# Patient Record
Sex: Female | Born: 2000 | Race: White | Hispanic: No | Marital: Single | Smoking: Current every day smoker
Health system: Southern US, Community
[De-identification: ages and names within clinical notes are randomized; demographics above are authoritative.]

## PROBLEM LIST (undated history)

## (undated) ENCOUNTER — Ambulatory Visit: Admission: EM | Payer: No Typology Code available for payment source

## (undated) ENCOUNTER — Inpatient Hospital Stay: Payer: Self-pay

## (undated) ENCOUNTER — Ambulatory Visit: Admission: EM | Payer: MEDICAID | Source: Home / Self Care

## (undated) DIAGNOSIS — E039 Hypothyroidism, unspecified: Secondary | ICD-10-CM

## (undated) DIAGNOSIS — O99280 Endocrine, nutritional and metabolic diseases complicating pregnancy, unspecified trimester: Secondary | ICD-10-CM

## (undated) DIAGNOSIS — R569 Unspecified convulsions: Secondary | ICD-10-CM

## (undated) HISTORY — PX: TYMPANOSTOMY TUBE PLACEMENT: SHX32

---
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
## 2018-01-25 DIAGNOSIS — E039 Hypothyroidism, unspecified: Secondary | ICD-10-CM | POA: Diagnosis not present

## 2018-01-25 DIAGNOSIS — O99283 Endocrine, nutritional and metabolic diseases complicating pregnancy, third trimester: Secondary | ICD-10-CM | POA: Diagnosis not present

## 2018-03-01 DIAGNOSIS — O0993 Supervision of high risk pregnancy, unspecified, third trimester: Secondary | ICD-10-CM | POA: Diagnosis not present

## 2018-03-01 DIAGNOSIS — E039 Hypothyroidism, unspecified: Secondary | ICD-10-CM | POA: Diagnosis not present

## 2018-03-01 DIAGNOSIS — O99283 Endocrine, nutritional and metabolic diseases complicating pregnancy, third trimester: Secondary | ICD-10-CM | POA: Diagnosis not present

## 2018-03-27 ENCOUNTER — Other Ambulatory Visit: Payer: Self-pay | Admitting: Certified Nurse Midwife

## 2018-03-27 ENCOUNTER — Observation Stay
Admission: EM | Admit: 2018-03-27 | Discharge: 2018-03-27 | Disposition: A | Discharge status: Home / Self Care | Payer: Medicaid Other | Source: Home / Self Care | Admitting: Obstetrics and Gynecology

## 2018-03-27 ENCOUNTER — Inpatient Hospital Stay
Admission: EM | Admit: 2018-03-27 | Discharge: 2018-03-30 | DRG: 806 | Disposition: A | Discharge status: Home / Self Care | Payer: Medicaid Other | Attending: Certified Nurse Midwife | Admitting: Certified Nurse Midwife

## 2018-03-27 ENCOUNTER — Inpatient Hospital Stay: Payer: Medicaid Other | Admitting: Anesthesiology

## 2018-03-27 ENCOUNTER — Other Ambulatory Visit: Payer: Self-pay

## 2018-03-27 ENCOUNTER — Encounter: Payer: Self-pay | Admitting: *Deleted

## 2018-03-27 DIAGNOSIS — Z8249 Family history of ischemic heart disease and other diseases of the circulatory system: Secondary | ICD-10-CM

## 2018-03-27 DIAGNOSIS — O479 False labor, unspecified: Secondary | ICD-10-CM | POA: Diagnosis present

## 2018-03-27 DIAGNOSIS — O9989 Other specified diseases and conditions complicating pregnancy, childbirth and the puerperium: Secondary | ICD-10-CM

## 2018-03-27 DIAGNOSIS — O26893 Other specified pregnancy related conditions, third trimester: Secondary | ICD-10-CM | POA: Diagnosis present

## 2018-03-27 DIAGNOSIS — Z3A40 40 weeks gestation of pregnancy: Secondary | ICD-10-CM

## 2018-03-27 DIAGNOSIS — O99283 Endocrine, nutritional and metabolic diseases complicating pregnancy, third trimester: Secondary | ICD-10-CM

## 2018-03-27 DIAGNOSIS — E039 Hypothyroidism, unspecified: Secondary | ICD-10-CM

## 2018-03-27 DIAGNOSIS — O99824 Streptococcus B carrier state complicating childbirth: Secondary | ICD-10-CM | POA: Diagnosis present

## 2018-03-27 DIAGNOSIS — O480 Post-term pregnancy: Secondary | ICD-10-CM | POA: Diagnosis not present

## 2018-03-27 DIAGNOSIS — Z6791 Unspecified blood type, Rh negative: Secondary | ICD-10-CM | POA: Diagnosis not present

## 2018-03-27 DIAGNOSIS — O471 False labor at or after 37 completed weeks of gestation: Secondary | ICD-10-CM

## 2018-03-27 DIAGNOSIS — D6959 Other secondary thrombocytopenia: Secondary | ICD-10-CM | POA: Diagnosis present

## 2018-03-27 DIAGNOSIS — O9912 Other diseases of the blood and blood-forming organs and certain disorders involving the immune mechanism complicating childbirth: Principal | ICD-10-CM | POA: Diagnosis present

## 2018-03-27 DIAGNOSIS — O9081 Anemia of the puerperium: Secondary | ICD-10-CM | POA: Diagnosis not present

## 2018-03-27 DIAGNOSIS — D62 Acute posthemorrhagic anemia: Secondary | ICD-10-CM | POA: Diagnosis not present

## 2018-03-27 DIAGNOSIS — M545 Low back pain: Secondary | ICD-10-CM

## 2018-03-27 DIAGNOSIS — O99284 Endocrine, nutritional and metabolic diseases complicating childbirth: Secondary | ICD-10-CM | POA: Diagnosis not present

## 2018-03-27 DIAGNOSIS — Z87891 Personal history of nicotine dependence: Secondary | ICD-10-CM

## 2018-03-27 DIAGNOSIS — O43123 Velamentous insertion of umbilical cord, third trimester: Secondary | ICD-10-CM | POA: Diagnosis present

## 2018-03-27 DIAGNOSIS — Z3483 Encounter for supervision of other normal pregnancy, third trimester: Secondary | ICD-10-CM | POA: Diagnosis not present

## 2018-03-27 HISTORY — DX: Hypothyroidism, unspecified: E03.9

## 2018-03-27 HISTORY — DX: Hypothyroidism, unspecified: O99.280

## 2018-03-27 LAB — URINALYSIS, COMPLETE (UACMP) WITH MICROSCOPIC
BACTERIA UA: NONE SEEN
Bilirubin Urine: NEGATIVE
GLUCOSE, UA: NEGATIVE mg/dL
HGB URINE DIPSTICK: NEGATIVE
Ketones, ur: NEGATIVE mg/dL
LEUKOCYTES UA: NEGATIVE
Nitrite: NEGATIVE
PROTEIN: NEGATIVE mg/dL
Specific Gravity, Urine: 1.013 (ref 1.005–1.030)
pH: 6 (ref 5.0–8.0)

## 2018-03-27 LAB — CBC
HCT: 34.4 % — ABNORMAL LOW (ref 35.0–47.0)
HEMOGLOBIN: 12.2 g/dL (ref 12.0–16.0)
MCH: 35.1 pg — AB (ref 26.0–34.0)
MCHC: 35.5 g/dL (ref 32.0–36.0)
MCV: 99.1 fL (ref 80.0–100.0)
PLATELETS: 121 10*3/uL — AB (ref 150–440)
RBC: 3.47 MIL/uL — ABNORMAL LOW (ref 3.80–5.20)
RDW: 13.3 % (ref 11.5–14.5)
WBC: 15.9 10*3/uL — ABNORMAL HIGH (ref 3.6–11.0)

## 2018-03-27 LAB — TSH: TSH: 20.289 u[IU]/mL — ABNORMAL HIGH (ref 0.350–4.500)

## 2018-03-27 LAB — T4, FREE: Free T4: 0.72 ng/dL — ABNORMAL LOW (ref 0.82–1.77)

## 2018-03-27 MED ORDER — ACETAMINOPHEN 325 MG PO TABS: 650.0000 mg | ORAL_TABLET | ORAL | Status: DC | PRN

## 2018-03-27 MED ORDER — HYDROCODONE-ACETAMINOPHEN 5-325 MG PO TABS
1.0000 | ORAL_TABLET | ORAL | Status: DC | PRN
  Administered 2018-03-27 (×2): 1 via ORAL
  Filled 2018-03-27: qty 1

## 2018-03-27 MED ORDER — AMMONIA AROMATIC IN INHA
RESPIRATORY_TRACT | Status: AC
  Filled 2018-03-27: qty 10

## 2018-03-27 MED ORDER — PENICILLIN G 3 MILLION UNITS IVPB - SIMPLE MED
3.0000 10*6.[IU] | INTRAVENOUS | Status: DC
  Administered 2018-03-27 – 2018-03-28 (×2): 3 10*6.[IU] via INTRAVENOUS
  Filled 2018-03-27 (×2): qty 100
  Filled 2018-03-27 (×2): qty 3
  Filled 2018-03-27: qty 100

## 2018-03-27 MED ORDER — FENTANYL 2.5 MCG/ML W/ROPIVACAINE 0.15% IN NS 100 ML EPIDURAL (ARMC)
12.0000 mL/h | EPIDURAL | Status: DC
  Administered 2018-03-28: 12 mL/h via EPIDURAL
  Filled 2018-03-27: qty 100

## 2018-03-27 MED ORDER — PHENYLEPHRINE 40 MCG/ML (10ML) SYRINGE FOR IV PUSH (FOR BLOOD PRESSURE SUPPORT): 80.0000 ug | PREFILLED_SYRINGE | INTRAVENOUS | Status: DC | PRN

## 2018-03-27 MED ORDER — MISOPROSTOL 200 MCG PO TABS
ORAL_TABLET | ORAL | Status: AC
  Administered 2018-03-28: 04:00:00
  Filled 2018-03-27: qty 4

## 2018-03-27 MED ORDER — LACTATED RINGERS IV SOLN: 500.0000 mL | INTRAVENOUS | Status: DC | PRN

## 2018-03-27 MED ORDER — SOD CITRATE-CITRIC ACID 500-334 MG/5ML PO SOLN: 30.0000 mL | ORAL | Status: DC | PRN

## 2018-03-27 MED ORDER — BUPIVACAINE HCL (PF) 0.25 % IJ SOLN
INTRAMUSCULAR | Status: DC | PRN
  Administered 2018-03-27: 5 mL via EPIDURAL

## 2018-03-27 MED ORDER — OXYTOCIN BOLUS FROM INFUSION
500.0000 mL | Freq: Once | INTRAVENOUS | Status: AC
  Administered 2018-03-28: 500 mL via INTRAVENOUS

## 2018-03-27 MED ORDER — LACTATED RINGERS IV SOLN: 500.0000 mL | Freq: Once | INTRAVENOUS | Status: DC

## 2018-03-27 MED ORDER — SODIUM CHLORIDE 0.9 % IV SOLN
5.0000 10*6.[IU] | Freq: Once | INTRAVENOUS | Status: AC
  Administered 2018-03-27: 5 10*6.[IU] via INTRAVENOUS
  Filled 2018-03-27: qty 5

## 2018-03-27 MED ORDER — LACTATED RINGERS IV SOLN
INTRAVENOUS | Status: DC
  Administered 2018-03-27: 20:00:00 via INTRAVENOUS
  Administered 2018-03-27: 1000 mL via INTRAVENOUS

## 2018-03-27 MED ORDER — HYDROCODONE-ACETAMINOPHEN 5-325 MG PO TABS
ORAL_TABLET | ORAL | Status: AC
  Filled 2018-03-27: qty 1

## 2018-03-27 MED ORDER — DIPHENHYDRAMINE HCL 50 MG/ML IJ SOLN: 12.5000 mg | INTRAMUSCULAR | Status: DC | PRN

## 2018-03-27 MED ORDER — FENTANYL 2.5 MCG/ML W/ROPIVACAINE 0.15% IN NS 100 ML EPIDURAL (ARMC)
EPIDURAL | Status: DC | PRN
  Administered 2018-03-27: 12 mL/h via EPIDURAL

## 2018-03-27 MED ORDER — OXYTOCIN 40 UNITS IN LACTATED RINGERS INFUSION - SIMPLE MED
1.0000 m[IU]/min | INTRAVENOUS | Status: DC
  Filled 2018-03-27: qty 1000

## 2018-03-27 MED ORDER — LIDOCAINE HCL (PF) 1 % IJ SOLN: 30.0000 mL | INTRAMUSCULAR | Status: DC | PRN

## 2018-03-27 MED ORDER — CYCLOBENZAPRINE HCL 10 MG PO TABS: 10.0000 mg | ORAL_TABLET | Freq: Three times a day (TID) | ORAL | 0 refills | Status: DC | PRN

## 2018-03-27 MED ORDER — FENTANYL 2.5 MCG/ML W/ROPIVACAINE 0.15% IN NS 100 ML EPIDURAL (ARMC)
EPIDURAL | Status: AC
  Filled 2018-03-27: qty 100

## 2018-03-27 MED ORDER — BUTORPHANOL TARTRATE 2 MG/ML IJ SOLN: 1.0000 mg | INTRAMUSCULAR | Status: DC | PRN

## 2018-03-27 MED ORDER — TERBUTALINE SULFATE 1 MG/ML IJ SOLN: 0.2500 mg | Freq: Once | INTRAMUSCULAR | Status: DC | PRN

## 2018-03-27 MED ORDER — OXYTOCIN 10 UNIT/ML IJ SOLN
INTRAMUSCULAR | Status: AC
  Filled 2018-03-27: qty 2

## 2018-03-27 MED ORDER — OXYTOCIN 40 UNITS IN LACTATED RINGERS INFUSION - SIMPLE MED
2.5000 [IU]/h | INTRAVENOUS | Status: DC
  Administered 2018-03-28: 2.5 [IU]/h via INTRAVENOUS

## 2018-03-27 MED ORDER — EPHEDRINE 5 MG/ML INJ: 10.0000 mg | INTRAVENOUS | Status: DC | PRN

## 2018-03-27 MED ORDER — MISOPROSTOL 25 MCG QUARTER TABLET: 25.0000 ug | ORAL_TABLET | ORAL | Status: DC | PRN

## 2018-03-27 MED ORDER — ONDANSETRON HCL 4 MG/2ML IJ SOLN
4.0000 mg | Freq: Four times a day (QID) | INTRAMUSCULAR | Status: DC | PRN
  Administered 2018-03-28: 4 mg via INTRAVENOUS
  Filled 2018-03-27: qty 2

## 2018-03-27 MED ORDER — MISOPROSTOL 25 MCG QUARTER TABLET: 25.0000 ug | ORAL_TABLET | Freq: Once | ORAL | Status: DC

## 2018-03-27 NOTE — H&P (Signed)
OB History & Physical   History of Present Illness:  Chief Complaint:   HPI:  Allison Castro is a 17 y.o. G1P0 female at [redacted]w[redacted]d dated by LMP c/w [redacted]w[redacted]d ultrasound.  She presents to L&D for induction due to limited fetal growth over past month.   She reports:  -active fetal movement -no leakage of fluid -no vaginal bleeding -onset of contractions at 2300 on 03/26/18 currently every 5 minutes  Pregnancy Issues: 1. Transfer of care from [facility] at 17 weeks 2. Varicella non-immune 3. Hypothyroidism with limited compliance with medical treatment 4. Underweight, BMI 16.9 5. Tobacco use in pregnancy 6. Rh Negative, RhoGAM given 01/04/2018 7. Gestational thrombocytopenia 8. GBS positive   Maternal Medical History:   Past Medical History:  Diagnosis Date  . Hypothyroidism affecting pregnancy     Past Surgical History:  Procedure Laterality Date  . TYMPANOSTOMY TUBE PLACEMENT      No Known Allergies  Prior to Admission medications   Medication Sig Start Date End Date Taking? Authorizing Provider  cyclobenzaprine (FLEXERIL) 10 MG tablet Take 1 tablet (10 mg total) by mouth every 8 (eight) hours as needed (back pain/muscle spasms). 03/27/18   Allison Del, CNM  ferrous sulfate 325 (65 FE) MG tablet Take 1 tablet (325 mg total) by mouth 2 (two) times daily with a meal. 03/27/18   Allison Del, CNM  levothyroxine (SYNTHROID) 175 MCG tablet Take 1 tablet (175 mcg total) by mouth daily before breakfast. 03/27/18   Allison Del, CNM  vitamin C (ASCORBIC ACID) 500 MG tablet Take twice daily with iron supplement 03/27/18   Allison Del, CNM     Prenatal care site: [facility]   Social History: She  reports that she quit smoking about 3 weeks ago. Her smoking use included cigarettes. She has a 1.50 pack-year smoking history. She has never used smokeless tobacco. She reports that she has current or past drug history. Drug:  Marijuana. She reports that she does not drink alcohol.  Family History: family history includes Heart disease in her paternal grandfather; Ovarian cancer in her maternal grandmother; Thyroid disease in her father.   Review of Systems: A full review of systems was performed and negative except as noted in the HPI.    Physical Exam:  Vital Signs: LMP 06/18/2017   General:   alert, cooperative, appears stated age and mild distress  Skin:  normal and no rash or abnormalities  Neurologic:    Alert & oriented x 3  Lungs:   clear to auscultation bilaterally  Heart:   regular rate and rhythm, S1, S2 normal, no murmur, click, rub or gallop  Abdomen:  soft, non-tender; bowel sounds normal; no masses,  no organomegaly  FHT:  125 BPM  Presentations: cephalic  Cervix:    Dilation: 3cm   Effacement: 80-90%   Station:  -1   Consistency: soft   Position: middle  Extremities: : non-tender, symmetric, no edema bilaterally.     EFW:  03/01/18: EFW=6lb5oz (2872g)=38%, AFI=13.61cm, FHR=136bpm 03/27/18: EFW=6lb10oz (3009g)=12%, AFI=12.61cm, FHR=123bpm   Results for orders placed or performed during the hospital encounter of 03/27/18 (from the past 24 hour(s))  Urinalysis, Complete w Microscopic     Status: Abnormal   Collection Time: 03/27/18  8:47 AM  Result Value Ref Range   Color, Urine YELLOW (A) YELLOW   APPearance CLEAR (A) CLEAR   Specific Gravity, Urine 1.013 1.005 - 1.030   pH 6.0 5.0 - 8.0   Glucose, UA NEGATIVE NEGATIVE mg/dL  Hgb urine dipstick NEGATIVE NEGATIVE   Bilirubin Urine NEGATIVE NEGATIVE   Ketones, ur NEGATIVE NEGATIVE mg/dL   Protein, ur NEGATIVE NEGATIVE mg/dL   Nitrite NEGATIVE NEGATIVE   Leukocytes, UA NEGATIVE NEGATIVE   RBC / HPF 0-5 0 - 5 RBC/hpf   WBC, UA 0-5 0 - 5 WBC/hpf   Bacteria, UA NONE SEEN NONE SEEN   Squamous Epithelial / LPF 0-5 0 - 5   Mucus PRESENT    Non Squamous Epithelial PRESENT (A) NONE SEEN  TSH     Status: Abnormal   Collection Time:  03/27/18  9:38 AM  Result Value Ref Range   TSH 20.289 (H) 0.350 - 4.500 uIU/mL  T4, free     Status: Abnormal   Collection Time: 03/27/18  9:38 AM  Result Value Ref Range   Free T4 0.72 (L) 0.82 - 1.77 ng/dL    Pertinent Results:  Prenatal Labs: Blood type/Rh A-  Antibody screen neg  Rubella Immune  Varicella Non-immune  RPR NR  HBsAg Neg  HIV NR  GC neg  Chlamydia neg  Genetic screening negative  1 hour GTT 129  3 hour GTT n/a  GBS positive   FHT: FHR: 130 bpm, variability: moderate,  accelerations:  Abscent,  decelerations:  Absent Category/reactivity:  Category I TOCO: regular, every 4-7 minutes   Assessment:  Allison Castro is a 17 y.o. G1P0 female at 4264w2d with concern for fetal growth restriction at term, poorly treated hypothyroidism due to non-compliance.   Plan:  1. Admit to Labor & Delivery; consents reviewed and obtained  2. Fetal Well being  - Fetal Tracing: category I - GBS positive, penicillin prophylaxis ordered - Presentation: vertex confirmed by ultrasound   3. Routine OB: - Prenatal labs reviewed, as above - Rh negative - CBC & T&S on admit - Clear fluids, IVF  4. Induction of Labor -  Contractions to be monitored with external toco in place -  Pelvis unproven -  Plan for augmentation with pitocin, possibly AROM -  Plan for continuous fetal monitoring  -  Maternal pain control as desired: IVPM, nitrous, regional anesthesia -  Anticipate vaginal delivery  5. Post Partum Planning: - Infant feeding: formula - Contraception: TBD  Allison Castro, CNM 03/27/2018 5:20 PM ----- Allison Castro Certified Nurse Midwife [facility], Department of OB/GYN [facility]

## 2018-03-27 NOTE — Anesthesia Preprocedure Evaluation (Signed)
Anesthesia Evaluation  Patient identified by MRN, date of birth, ID band Patient awake    Reviewed: Allergy & Precautions, NPO status , Patient's Chart, lab work & pertinent test results, reviewed documented beta blocker date and time   Airway Mallampati: II  TM Distance: >3 FB     Dental  (+) Chipped   Pulmonary former smoker,           Cardiovascular      Neuro/Psych    GI/Hepatic   Endo/Other  Hypothyroidism   Renal/GU      Musculoskeletal   Abdominal   Peds  Hematology   Anesthesia Other Findings   Reproductive/Obstetrics                             Anesthesia Physical Anesthesia Plan  ASA: II  Anesthesia Plan: Epidural   Post-op Pain Management:    Induction:   PONV Risk Score and Plan:   Airway Management Planned:   Additional Equipment:   Intra-op Plan:   Post-operative Plan:   Informed Consent: I have reviewed the patients History and Physical, chart, labs and discussed the procedure including the risks, benefits and alternatives for the proposed anesthesia with the patient or authorized representative who has indicated his/her understanding and acceptance.     Plan Discussed with: CRNA  Anesthesia Plan Comments:         Anesthesia Quick Evaluation

## 2018-03-27 NOTE — Anesthesia Procedure Notes (Signed)
Epidural Patient location during procedure: OB  Staffing Anesthesiologist: Roosevelt Bisher, MD Performed: anesthesiologist   Preanesthetic Checklist Completed: patient identified, site marked, surgical consent, pre-op evaluation, timeout performed, IV checked, risks and benefits discussed and monitors and equipment checked  Epidural Patient position: sitting Prep: ChloraPrep Patient monitoring: heart rate, continuous pulse ox and blood pressure Approach: midline Location: L4-L5 Injection technique: LOR saline  Needle:  Needle type: Tuohy  Needle gauge: 18 G Needle length: 9 cm and 9 Catheter type: closed end flexible Catheter size: 20 Guage Test dose: negative and 1.5% lidocaine with Epi 1:200 K  Assessment Sensory level: T10 Events: blood not aspirated, injection not painful, no injection resistance, negative IV test and no paresthesia  Additional Notes   Patient tolerated the insertion well without complications.Reason for block:procedure for pain     

## 2018-03-27 NOTE — Discharge Summary (Addendum)
Allison Castro is a 17 y.o. female. She is at 5655w2d gestation. Patient's last menstrual period was 06/18/2017. Estimated Date of Delivery: 03/25/18  [facility]   Chief complaint: contractions Location: abdomen Onset/timing: last night around 2300 Duration: intermittent Quality: cramping, pain Severity: moderate to severe pain with contractions Aggravating or alleviating conditions: none Associated signs/symptoms: none Context: Allison Castro reports that she first started feeling contractions late yesterday evening. She reports that they were coming every 15-30 minutes. She was able to sleep some in between her contractions. She tried taking Tylenol, which didn't seem to help much with the pain.   S: Resting comfortably.   She reports:  -active fetal movement -no leakage of fluid  -no vaginal bleeding  Maternal Medical History:   Past Medical History:  Diagnosis Date  . Hypothyroidism affecting pregnancy     Past Surgical History:  Procedure Laterality Date  . TYMPANOSTOMY TUBE PLACEMENT      No Known Allergies  Prior to Admission medications   Medication Sig Start Date End Date Taking? Authorizing Provider  cyclobenzaprine (FLEXERIL) 10 MG tablet Take 1 tablet (10 mg total) by mouth 3 (three) times daily as needed. Patient not taking: Reported on 03/27/2018 11/28/16   Joni ReiningSmith, Ronald K, PA-C  ibuprofen (ADVIL,MOTRIN) 400 MG tablet Take 1 tablet (400 mg total) by mouth every 6 (six) hours as needed. Patient not taking: Reported on 03/27/2018 11/28/16   Joni ReiningSmith, Ronald K, PA-C     Social History: She  reports that she quit smoking about 3 weeks ago. Her smoking use included cigarettes. She has a 1.50 pack-year smoking history. She has never used smokeless tobacco. She reports that she has current or past drug history. Drug: Marijuana. She reports that she does not drink alcohol.  Family History: family history includes Heart disease in her paternal  grandfather; Ovarian cancer in her maternal grandmother; Thyroid disease in her father.   Review of Systems: A full review of systems was performed and negative except as noted in the HPI.     O:  BP 120/85 (BP Location: Right Arm)   Pulse 73   Temp 98.4 F (36.9 C) (Oral)   Resp 16   Ht 5' (1.524 m)   Wt 49.4 kg   LMP 06/18/2017   BMI 21.29 kg/m  Results for orders placed or performed during the hospital encounter of 03/27/18 (from the past 48 hour(s))  Urinalysis, Complete w Microscopic   Collection Time: 03/27/18  8:47 AM  Result Value Ref Range   Color, Urine YELLOW (A) YELLOW   APPearance CLEAR (A) CLEAR   Specific Gravity, Urine 1.013 1.005 - 1.030   pH 6.0 5.0 - 8.0   Glucose, UA NEGATIVE NEGATIVE mg/dL   Hgb urine dipstick NEGATIVE NEGATIVE   Bilirubin Urine NEGATIVE NEGATIVE   Ketones, ur NEGATIVE NEGATIVE mg/dL   Protein, ur NEGATIVE NEGATIVE mg/dL   Nitrite NEGATIVE NEGATIVE   Leukocytes, UA NEGATIVE NEGATIVE   RBC / HPF 0-5 0 - 5 RBC/hpf   WBC, UA 0-5 0 - 5 WBC/hpf   Bacteria, UA NONE SEEN NONE SEEN   Squamous Epithelial / LPF 0-5 0 - 5   Mucus PRESENT    Non Squamous Epithelial PRESENT (A) NONE SEEN     Constitutional: NAD, AAOx3, appears very comfortable HE/ENT: extraocular movements grossly intact, moist mucous membranes CV: RRR PULM: normal respiratory effort, CTABL     Abd: gravid, non-tender, non-distended, soft  Back: no CVAT, mild b/l lower  back tenderness     Ext: Non-tender, Nonedmeatous   Psych: mood appropriate, speech normal Pelvic: 3cm/60%/-2 middle/medium  NST/monitoring:  Baseline: 130bpm Variability: moderate Accelerations: present x >2 Decelerations: absent Time: 4 hours Toco: occasional and irregular   A/P: 17 y.o. [redacted]w[redacted]d here for antenatal surveillance for contractions  Labor  Occasional and irregular contractions  No cervical change over 4 hours  Fetal Wellbeing  Reactive NST, reassuring for GA  Low back pain  UA  with no evidence of UTI.   Advised hydration, Tylenol PRN, heat application, and pregnancy support band. Prescription for Flexeril 10mg  q8h PRN sent to preferred pharmacy.  History of hypothyroidism in pregnancy  Poor compliance to medication therapy, follow-up appointments, and fetal testing  TSH and free T4 drawn today, pending  D/c home stable, precautions reviewed, follow-up as scheduled.   Growth ultrasound scheduled for today at 1500.  Routine prenatal appointment with NST for Friday, 03/29/18 at 1330.   IOL scheduled for 03/03/18 at 0001 if needed.    Genia Del 03/27/2018 9:29 AM  ----- Genia Del, CNM Certified Nurse Midwife [facility], Department of OB/GYN [facility]

## 2018-03-27 NOTE — Progress Notes (Signed)
Called by nursing to due prolonged deceleration, which was resolved at time of call. In strip review, FHT are not tracing starting at 2242. RN reports that she went to patient's bedside at 2246 to adjust monitor, [facility] which [facility] 100. Recovered to baseline over 6 minute period with repositioning, IVF bolus, and continued oxygen. FHT now with baseline 130bpm with moderate variability. Toco with regular contractions every 2-3 minutes.   SVE per RN during this episode 6cm/90%/-1.   Continue expectant management at this time with close monitoring of fetal heart tracing.   Allison Castro, [place] 03/27/2018 11:02 PM

## 2018-03-27 NOTE — OB Triage Note (Signed)
Pt is a G1P0 40wk2d seen in triage w/ c/o of ctx. Pt states ctx started around 2300 and have varied in frequency. Pt denies LOF and vaginal bleeding. Pt states positive fetal movement. Pt rates her pain a 8/10. Montiors applied and assessing. Will continue to monitor.

## 2018-03-27 NOTE — Progress Notes (Signed)
Labor Progress Note  Allison Castro is a 17 y.o. G1P0 at 4374w2d by LMP c/w 2150w5d u/s admitted for limited fetal growth over the past month.   Subjective:  Much more comfortable with epidural in place.   Objective: BP 121/78 (BP Location: Right Arm)   Pulse 65   Temp 98.1 F (36.7 C) (Oral)   Resp 16   Ht 5' (1.524 m)   Wt 49 kg   LMP 06/18/2017   SpO2 99%   BMI 21.09 kg/m   Fetal Assessment: FHT:  FHR: 135 bpm, variability: minimal, moderate,  accelerations:  Present,  decelerations:  Present variable Category/reactivity:  Category II UC:   regular, every 3 minutes while tracing well SVE:    Dilation: 5cm  Effacement: 90%  Station:  -1  Consistency: soft  Position: middle  Membrane status: SROM 1913, AROM forebag 1924 Amniotic color: clear, blood-tinged  Labs: Lab Results  Component Value Date   WBC 15.9 (H) 03/27/2018   HGB 12.2 03/27/2018   HCT 34.4 (L) 03/27/2018   MCV 99.1 03/27/2018   PLT 121 (L) 03/27/2018    Assessment / Plan: Latent labor  Labor: Latent labor, s/p SROM and AROM of forebag, contraction pattern not tracing well recently, monitor adjusted. If contractions are regular, continue to watch. If contractions have spaced out, plan to start low-dose pitocin.  Fetal Wellbeing:  Category II for periods of minimal variability and variable decels, overall reassuring Pain Control:  Epidural I/D:  n/a Anticipated MOD:  NSVD  Discussed tracing and plan of care with Dr. Feliberto GottronSchermerhorn, who is in agreement with this plan of care.   Genia DelMargaret Rashan Rounsaville, CNM 03/27/2018, 10:01 PM

## 2018-03-27 NOTE — Progress Notes (Signed)
Labor Progress Note  Allison Castro is a 17 y.o. G1P0 at 11088w2d by LMP c/w 2813w5d u/s admitted for limited fetal growth over the past month.   Subjective:  Reporting increasing back pain.   Objective: BP 123/79 (BP Location: Left Arm)   Pulse 70   Temp 98.3 F (36.8 C) (Oral)   Resp 18   Ht 5' (1.524 m)   Wt 49 kg   LMP 06/18/2017   BMI 21.09 kg/m   Fetal Assessment: FHT:  FHR: 125 bpm, variability: minimal, moderate,  accelerations:  Present,  decelerations:  Present variable Category/reactivity:  Category II UC:   regular, every 2-5 minutes SVE:    Dilation: 3-4cm  Effacement: 90%  Station:  -1  Consistency: soft  Position: middle  Membrane status: SROM 1913, AROM forebag 1924 Amniotic color: clear, blood-tinged  Labs: Lab Results  Component Value Date   WBC 15.9 (H) 03/27/2018   HGB 12.2 03/27/2018   HCT 34.4 (L) 03/27/2018   MCV 99.1 03/27/2018   PLT 121 (L) 03/27/2018    Assessment / Plan: Latent labor  Labor: Latent labor, s/p SROM and AROM of forebag Fetal Wellbeing:  Category II for periods of minimal variability and variable decels, overall reassuring Pain Control:  Requesting epidural now I/D:  n/a Anticipated MOD:  NSVD  Allison Castro, CNM 03/27/2018, 7:38 PM

## 2018-03-28 LAB — CBC
HEMATOCRIT: 24.9 % — AB (ref 35.0–47.0)
Hemoglobin: 8.7 g/dL — ABNORMAL LOW (ref 12.0–16.0)
MCH: 34.8 pg — AB (ref 26.0–34.0)
MCHC: 35.1 g/dL (ref 32.0–36.0)
MCV: 99.2 fL (ref 80.0–100.0)
Platelets: 89 10*3/uL — ABNORMAL LOW (ref 150–440)
RBC: 2.51 MIL/uL — ABNORMAL LOW (ref 3.80–5.20)
RDW: 13.3 % (ref 11.5–14.5)
WBC: 12.9 10*3/uL — AB (ref 3.6–11.0)

## 2018-03-28 LAB — TYPE AND SCREEN
ABO/RH(D): A NEG
ANTIBODY SCREEN: POSITIVE
UNIT DIVISION: 0
Unit division: 0

## 2018-03-28 LAB — BPAM RBC
BLOOD PRODUCT EXPIRATION DATE: 201909022359
Blood Product Expiration Date: 201909122359
Unit Type and Rh: 600
Unit Type and Rh: 600

## 2018-03-28 LAB — RPR: RPR Ser Ql: NONREACTIVE

## 2018-03-28 MED ORDER — VARICELLA VIRUS VACCINE LIVE 1350 PFU/0.5ML IJ SUSR
0.5000 mL | INTRAMUSCULAR | Status: AC | PRN
  Administered 2018-03-30: 0.5 mL via SUBCUTANEOUS
  Filled 2018-03-28 (×2): qty 0.5

## 2018-03-28 MED ORDER — COCONUT OIL OIL: 1.0000 "application " | TOPICAL_OIL | Status: DC | PRN

## 2018-03-28 MED ORDER — DIBUCAINE 1 % RE OINT: 1.0000 "application " | TOPICAL_OINTMENT | RECTAL | Status: DC | PRN

## 2018-03-28 MED ORDER — FERROUS SULFATE 325 (65 FE) MG PO TABS
325.0000 mg | ORAL_TABLET | Freq: Two times a day (BID) | ORAL | Status: DC
  Administered 2018-03-28 – 2018-03-29 (×4): 325 mg via ORAL
  Filled 2018-03-28 (×5): qty 1

## 2018-03-28 MED ORDER — LACTATED RINGERS IV SOLN
INTRAVENOUS | Status: DC
  Administered 2018-03-28: 02:00:00 via INTRAUTERINE

## 2018-03-28 MED ORDER — METHYLERGONOVINE MALEATE 0.2 MG/ML IJ SOLN: 0.2000 mg | Freq: Once | INTRAMUSCULAR | Status: DC

## 2018-03-28 MED ORDER — METHYLERGONOVINE MALEATE 0.2 MG/ML IJ SOLN
INTRAMUSCULAR | Status: AC
  Administered 2018-03-28: 0.2 mg
  Filled 2018-03-28: qty 1

## 2018-03-28 MED ORDER — MEDROXYPROGESTERONE ACETATE 150 MG/ML IM SUSP
150.0000 mg | INTRAMUSCULAR | Status: AC | PRN
  Administered 2018-03-30: 150 mg via INTRAMUSCULAR
  Filled 2018-03-28: qty 1

## 2018-03-28 MED ORDER — DIPHENHYDRAMINE HCL 25 MG PO CAPS
25.0000 mg | ORAL_CAPSULE | Freq: Four times a day (QID) | ORAL | Status: DC | PRN
  Administered 2018-03-28: 25 mg via ORAL
  Filled 2018-03-28: qty 1

## 2018-03-28 MED ORDER — ONDANSETRON HCL 4 MG/2ML IJ SOLN
4.0000 mg | INTRAMUSCULAR | Status: DC | PRN
  Administered 2018-03-28: 4 mg via INTRAVENOUS
  Filled 2018-03-28: qty 2

## 2018-03-28 MED ORDER — PRENATAL MULTIVITAMIN CH
1.0000 | ORAL_TABLET | Freq: Every day | ORAL | Status: DC
  Administered 2018-03-28 – 2018-03-29 (×2): 1 via ORAL
  Filled 2018-03-28 (×3): qty 1

## 2018-03-28 MED ORDER — CEFAZOLIN SODIUM-DEXTROSE 1-4 GM/50ML-% IV SOLN
1.0000 g | Freq: Once | INTRAVENOUS | Status: AC
  Administered 2018-03-28: 1 g via INTRAVENOUS
  Filled 2018-03-28: qty 50

## 2018-03-28 MED ORDER — IBUPROFEN 600 MG PO TABS
600.0000 mg | ORAL_TABLET | Freq: Four times a day (QID) | ORAL | Status: DC
  Administered 2018-03-28 – 2018-03-30 (×8): 600 mg via ORAL
  Filled 2018-03-28 (×8): qty 1

## 2018-03-28 MED ORDER — BENZOCAINE-MENTHOL 20-0.5 % EX AERO
1.0000 "application " | INHALATION_SPRAY | CUTANEOUS | Status: DC | PRN
  Filled 2018-03-28: qty 56

## 2018-03-28 MED ORDER — SENNOSIDES-DOCUSATE SODIUM 8.6-50 MG PO TABS
2.0000 | ORAL_TABLET | ORAL | Status: DC
  Administered 2018-03-29 (×2): 2 via ORAL
  Filled 2018-03-28 (×3): qty 2

## 2018-03-28 MED ORDER — SIMETHICONE 80 MG PO CHEW: 160.0000 mg | CHEWABLE_TABLET | ORAL | Status: DC | PRN

## 2018-03-28 MED ORDER — LEVOTHYROXINE SODIUM 175 MCG PO TABS
175.0000 ug | ORAL_TABLET | Freq: Every day | ORAL | Status: DC
  Administered 2018-03-28 – 2018-03-30 (×3): 175 ug via ORAL
  Filled 2018-03-28 (×3): qty 1

## 2018-03-28 MED ORDER — ACETAMINOPHEN 325 MG PO TABS
650.0000 mg | ORAL_TABLET | ORAL | Status: DC | PRN
  Administered 2018-03-28 – 2018-03-29 (×6): 650 mg via ORAL
  Filled 2018-03-28 (×8): qty 2

## 2018-03-28 MED ORDER — WITCH HAZEL-GLYCERIN EX PADS: 1.0000 "application " | MEDICATED_PAD | CUTANEOUS | Status: DC | PRN

## 2018-03-28 MED ORDER — ONDANSETRON HCL 4 MG PO TABS: 4.0000 mg | ORAL_TABLET | ORAL | Status: DC | PRN

## 2018-03-28 MED ORDER — MISOPROSTOL 200 MCG PO TABS: 800.0000 ug | ORAL_TABLET | Freq: Once | ORAL | Status: DC

## 2018-03-28 NOTE — Progress Notes (Signed)
SVE: complete/complete/+2 station Good engagement of pelvic floor muscles with practice push.   Plan to start pushing now.   Allison Castro, CNM 03/28/2018 3:11 AM

## 2018-03-28 NOTE — Discharge Summary (Signed)
Obstetric Discharge Summary   Patient ID: Patient Name: Allison Castro DOB: Jun 30, 2001 MRN: [id number]  Date of Admission: 03/27/2018 Date of Delivery: 03/28/2018 Delivered by: Genia Del, CNM Date of Discharge: 03/30/2018  Primary OB: [facility]  WUJ:WJXBJYN'W last menstrual period was 06/18/2017. EDC Estimated Date of Delivery: 03/25/18 Gestational Age at Delivery: [redacted]w[redacted]d   Antepartum complications:  1. Transfer of care from [facility] at 17 weeks 2. Varicella non-immune 3. Hypothyroidism with limited compliance with medical treatment and TSH of 20 on admission 4. Underweight, BMI 16.9 5. Tobacco use in pregnancy 6. Rh Negative, RhoGAM given 01/04/2018 7. Gestational thrombocytopenia 8. GBS positive  Admitting Diagnosis: augmentation of labor due to concern for limited fetal growth  Secondary Diagnoses: Patient Active Problem List   Diagnosis Date Noted  . Uterine contractions during pregnancy 03/27/2018  . Labor and delivery indication for care or intervention 03/27/2018    Augmentation: AROM Complications: Hemorrhage>1075mL Intrapartum complications/course: Allison Castro presented to L&D for augmentation of labor due to concern for fetal growth restriction. She was augmented with AROM. Epidural placed. She progressed to complete and had a spontaneous vaginal birth of a live female over an intact perineum. The fetal head was delivered in direct OA position with restitution to LOA. No nuchal cord. Anterior then posterior shoulders delivered with minimal assistance. Baby placed on mom's abdomen and attended to by transition RN. Cord clamped and cut after 2 minute delay by father of the baby. Cord segment given to nurse for gases. Cord blood obtained for newborn labs. Controlled cord traction performed after delivery of baby and cutting of the cord. Cord advanced in the usual fashion, then abruptly avulsed without any warning signs.  Manual removal attempted immediately, but placenta was still attached to uterus. At this point, bleeding was stable. Called CNM Sharen Hones to the bedside to ask her advice and she advised to call back-up physician and start low-dose pitocin for hemorrhage prophylaxis. Nursing staff called Dr. Feliberto Gottron to inform him of the situation and request him to come in. While waiting for Dr. Francesca Oman arrival, multiple large gushes of blood came from the vagina. The uterus was found to be firm. misoprostol per rectum and 0.2mg  Methergine IM were given for hemorrhage prophylaxis. Manual removal of the placenta was attempted, but with difficultly of finding the plane to remove the placenta, removal attempt was aborted. When Dr. Feliberto Gottron arrived, the patient was repositioned for addition attempt at manual removal. At this point, the placenta spontaneously delivered with fundal pressure. Placenta appeared intact, with possible marginal or velamentous cord insertion. 3VC. Bolus of IV pitocin for hemorrhage prophylaxis at this time, with uterine tone firm and bleeding scant. Ancef 1g IV given due to attempted manual extraction.   Delivery Type: spontaneous vaginal delivery Anesthesia: epidural Placenta: spontaneous  Laceration: b/l labial abrasions, hemostatic Episiotomy: none  Newborn Data: Live born female "Allison Castro" Birth Weight: 6lb 5oz APGAR: 8, 9  Newborn Delivery   Birth date/time:  03/28/2018 04:10:00 Delivery type:  Vaginal, Spontaneous     Postpartum Course  Patient had an uncomplicated postpartum course.  By time of discharge on PPD#2, her pain was controlled on oral pain medications; she had appropriate lochia and was ambulating, voiding without difficulty and tolerating regular diet.  She was deemed stable for discharge to home.    Her hgb dropped to a low of 8.7 and she remained hemodynamically stable. She did not require blood transfusion postpartum. She received tdap prior to  delivery, and received her  varicella vaccine prior to discharge.   On discharge, it was emphasized that she start/continue taking her home synthroid and she will f/u in the office in 4 weeks for repeat TSH and check on emotional stability.     Labs: CBC Latest Ref Rng & Units 03/29/2018 03/28/2018 03/27/2018  WBC 3.6 - 11.0 K/uL 11.9(H) 12.9(H) 15.9(H)  Hemoglobin 12.0 - 16.0 g/dL 9.0(L) 8.7(L) 12.2  Hematocrit 35.0 - 47.0 % 25.7(L) 24.9(L) 34.4(L)  Platelets 150 - 440 K/uL 104(L) 89(L) 121(L)   A NEG Performed at [facility], [address] [place], [place] [address]   Physical exam:  BP 104/61 (BP Location: Left Arm)   Pulse (!) 56   Temp 98.1 F (36.7 C) (Oral)   Resp 18   Ht 5' (1.524 m)   Wt 49 kg   LMP 06/18/2017   SpO2 98%   Breastfeeding? Unknown   BMI 21.09 kg/m  General: alert and no distress Pulm: normal respiratory effort Lochia: appropriate Abdomen: soft, NT Uterine Fundus: firm, below umbilicus Extremities: No evidence of DVT seen on physical exam. No lower extremity edema.   Disposition: stable, discharge to home Baby Feeding: formula Baby Disposition: home with mom  Contraception: Depo-Provera  Prenatal Labs:  Blood type/Rh A-  Antibody screen neg  Rubella Immune  Varicella Non-immune  RPR NR  HBsAg Neg  HIV NR  GC neg  Chlamydia neg  Genetic screening negative  1 hour GTT 129  3 hour GTT n/a  GBS positive   Rh Immune globulin given:yes Rubella vaccine given: n/a Varivax given: given  Tdap vaccine given in AP or PP setting: AP 01/18/2018 Flu vaccine given in AP or PP setting: declined AP  Plan:  Allison Castro was discharged to home in good condition. Follow-up appointment at [facility] with delivery provider in 4 weeks for [facility]  Discharge Instructions: Per After Visit Summary. Activity: Advance as tolerated. Pelvic rest for 6 weeks.   Diet: Regular Discharge Medications: Allergies as of 03/30/2018   No Known  Allergies     Medication List    TAKE these medications   cyclobenzaprine 10 MG tablet Commonly known as:  FLEXERIL Take 1 tablet (10 mg total) by mouth every 8 (eight) hours as needed (back pain/muscle spasms).   ferrous sulfate 325 (65 FE) MG tablet Take 1 tablet (325 mg total) by mouth 2 (two) times daily with a meal.   ibuprofen 600 MG tablet Commonly known as:  ADVIL,MOTRIN Take 1 tablet (600 mg total) by mouth every 6 (six) hours.   medroxyPROGESTERone 150 MG/ML injection Commonly known as:  DEPO-PROVERA Inject 1 mL (150 mg total) into the muscle every 3 (three) months.   SYNTHROID 175 MCG tablet Generic drug:  levothyroxine Take 1 tablet (175 mcg total) by mouth daily before breakfast.   vitamin C 500 MG tablet Commonly known as:  ASCORBIC ACID Take twice daily with iron supplement      Outpatient follow up:  Follow-up Information    Genia Del, CNM. Schedule an appointment as soon as possible for a visit in 6 week(s).   Specialty:  Certified Nurse Midwife Why:  Please call to schedule your 6 week postpartum follow up appointment with Genia Del  Contact information: [address] [place] [address] [phone]          Discharge diagnoses: - acute blood loss anemia - significant hypothyroidism - postpartum - formula feeding - rh negative mom with rh positive infantt -  underweight: BMI 21 at [redacted] weeks pregnant - leukocytosis: stress response -   Signed: Christeen DouglasBethany Nashira Mcglynn 03/30/18

## 2018-03-28 NOTE — Clinical Social Work Maternal (Signed)
  CLINICAL SOCIAL WORK MATERNAL/CHILD NOTE  Patient Details  Name: Allison Castro MRN: [id number] Date of Birth: 04-Apr-2001  Date:  03/28/2018  Clinical Social Worker Initiating Note:  Allison SpanielMonica Naythan Douthit MSW,LCSW Date/Time: Initiated:  03/28/18/      Child's Name:      Biological Parents:  Mother, Father   Need for Interpreter:  None   Reason for Referral:  Other (Comment)(potential unstable housing)   Address:  [address] [place] [address]    Phone number:  [phone] (home)     Additional phone number: none  Household Members/Support Persons (HM/SP):       HM/SP Name Relationship DOB or Age  HM/SP -1        HM/SP -2        HM/SP -3        HM/SP -4        HM/SP -5        HM/SP -6        HM/SP -7        HM/SP -8          Natural Supports (not living in the home):  Friends, Immediate Family   Professional Supports:     Employment: Unemployed   Type of Work:     Education:      Homebound arranged:    Architectinancial Resources:      Other Resources:  Sales executive[employer] , [facility]   Cultural/Religious Considerations Which May Impact Care:  none  Strengths:  Ability to meet basic needs , Home prepared for child    Psychotropic Medications:         Pediatrician:       [place] List:   [facility]    [place]    [place]    [place]    [place]    [place]      Pediatrician Fax Number:    Risk Factors/Current Problems:  None   Cognitive State:  Alert , Able to Concentrate    Mood/Affect:  Bright , Calm    CSW Assessment: CSW spoke with patient this afternoon along with father of baby and paternal grandmother and maternal grandmother in the room. Patient requested that they stay. Patient informed CSW that she and her boyfriend/father of baby will be living together in their new apartment. She stated that they had been living out of a hotel but recently moved to their apartment. Patient states that they have family support.  She also stated that they have all necessities for them and for baby. Patient reports she has transportation and the paternal grandmother confirmed. CSW provided education regarding postpartum depression. Patient denies any mental illness and states she used marijuana in the first 3 months for nausea but then stopped because her physician put her on medication for the nausea. Patient reports she did receive prenatal care. Patient's paternal grandmother states that anything that they should need the family can come together and assist with but that they have good support. They have no questions or concerns at this time.   CSW Plan/Description:  Psychosocial Support and Ongoing Assessment of Needs    Allison Castro, [place] 03/28/2018, 3:12 PM   CSW Plan/Description:  Psychosocial Support and Ongoing Assessment of Needs    Allison SpanielMonica Orlene Salmons, LCSW 03/28/2018, 3:22 PM

## 2018-03-28 NOTE — Progress Notes (Signed)
Labor Progress Note  Allison Castro is a 17 y.o. G1P0 at 4766w2d by LMP c/w 5638w5d u/s admitted for limited fetal growth over the past month.   Subjective:  Asleep in bed. Not feeling contractions at all.    Objective: BP 118/81 (BP Location: Left Arm)   Pulse 84   Temp 98.2 F (36.8 C) (Oral)   Resp 14   Ht 5' (1.524 m)   Wt 49 kg   LMP 06/18/2017   SpO2 97%   BMI 21.09 kg/m   Fetal Assessment: FHT:  FHR: 135 bpm, variability: minimal, moderate,  accelerations:  Present,  decelerations:  Absent Category/reactivity:  Category II UC:   regular, every 2-5 SVE:    Dilation: 9cm  Effacement: 90%  Station:  +1  Consistency: soft  Position: middle  Membrane status: SROM 1913, AROM forebag 1924 Amniotic color: clear, blood-tinged  Labs: Lab Results  Component Value Date   WBC 15.9 (H) 03/27/2018   HGB 12.2 03/27/2018   HCT 34.4 (L) 03/27/2018   MCV 99.1 03/27/2018   PLT 121 (L) 03/27/2018    Assessment / Plan: Active labor  Labor: Active labor.  Fetal Wellbeing:  Category II for periods of minimal variability and variable decels, overall reassuring. Variables resolved with amnioinfusion. Maintenance infusing at 14925mL/hr maintenance fluid.  Pain Control:  Epidural I/D:  n/a Anticipated MOD:  NSVD   Genia DelMargaret Lisamarie Coke, CNM 03/28/2018, 2:09 AM

## 2018-03-28 NOTE — Progress Notes (Signed)
Labor Progress Note  Allison Castro is a 17 y.o. G1P0 at 6952w2d by LMP c/w 4839w5d u/s admitted for limited fetal growth over the past month.   Subjective:  Asleep in bed. Not feeling contractions at all.    Objective: BP 113/70   Pulse 83   Temp 98.6 F (37 C) (Oral)   Resp 14   Ht 5' (1.524 m)   Wt 49 kg   LMP 06/18/2017   SpO2 98%   BMI 21.09 kg/m   Fetal Assessment: FHT:  FHR: 140 bpm, variability: minimal, moderate,  accelerations:  Present,  decelerations:  Present variable Category/reactivity:  Category II UC:   regular, every 2-5 SVE:    Dilation: 7.5cm  Effacement: 90%  Station:  +1  Consistency: soft  Position: middle  Membrane status: SROM 1913, AROM forebag 1924 Amniotic color: clear, blood-tinged  Labs: Lab Results  Component Value Date   WBC 15.9 (H) 03/27/2018   HGB 12.2 03/27/2018   HCT 34.4 (L) 03/27/2018   MCV 99.1 03/27/2018   PLT 121 (L) 03/27/2018    Assessment / Plan: Active labor  Labor: Active labor.  Fetal Wellbeing:  Category II for periods of minimal variability and variable decels, overall reassuring, but deep variable decels present. Non-recurrent at this point, but frequent enough to potentially benefit from amnioinfusion. IUPC placed with 250mL LR bolus ordered and 15725mL/hr maintenance fluid.  Pain Control:  Epidural I/D:  n/a Anticipated MOD:  NSVD   Genia DelMargaret Betsaida Castro, CNM 03/28/2018, 1:05 AM

## 2018-03-29 LAB — CBC
HCT: 25.7 % — ABNORMAL LOW (ref 35.0–47.0)
Hemoglobin: 9 g/dL — ABNORMAL LOW (ref 12.0–16.0)
MCH: 34.9 pg — AB (ref 26.0–34.0)
MCHC: 34.9 g/dL (ref 32.0–36.0)
MCV: 99.9 fL (ref 80.0–100.0)
PLATELETS: 104 10*3/uL — AB (ref 150–440)
RBC: 2.57 MIL/uL — AB (ref 3.80–5.20)
RDW: 13.8 % (ref 11.5–14.5)
WBC: 11.9 10*3/uL — ABNORMAL HIGH (ref 3.6–11.0)

## 2018-03-29 LAB — FETAL SCREEN: FETAL SCREEN: NEGATIVE

## 2018-03-29 LAB — ABO/RH: ABO/RH(D): A NEG

## 2018-03-29 MED ORDER — RHO D IMMUNE GLOBULIN 1500 UNIT/2ML IJ SOSY
300.0000 ug | PREFILLED_SYRINGE | Freq: Once | INTRAMUSCULAR | Status: AC
  Administered 2018-03-29: 300 ug via INTRAVENOUS
  Filled 2018-03-29: qty 2

## 2018-03-29 NOTE — Progress Notes (Signed)
Post Partum Day 1 Subjective: no complaints Back a bit sore  Objective: Blood pressure 108/67, pulse 64, temperature 98.1 F (36.7 C), temperature source Oral, resp. rate 20, height 5' (1.524 m), weight 49 kg, last menstrual period 06/18/2017, SpO2 100 %, unknown if currently breastfeeding.  Physical Exam:  General: alert and cooperative  Lungs CTA   CV RRR  Lochia: appropriate Uterine Fundus: firm  DVT Evaluation: No evidence of DVT seen on physical exam.  Recent Labs    03/28/18 2100 03/29/18 0509  HGB 8.7* 9.0*  HCT 24.9* 25.7*    Assessment/Plan: Plan for discharge tomorrow Rhogam today   LOS: 2 days   Ihor Austinhomas J Titus Drone 03/29/2018, 7:26 AM

## 2018-03-29 NOTE — Plan of Care (Signed)
  Problem: Education: Goal: Knowledge of General Education information will improve Description Including pain rating scale, medication(s)/side effects and non-pharmacologic comfort measures Outcome: Progressing   Problem: Health Behavior/Discharge Planning: Goal: Ability to manage health-related needs will improve Outcome: Progressing  Pt needs a lot of encouragement to feed infant or any infant care. Needs education on proper care of infant.

## 2018-03-29 NOTE — Anesthesia Postprocedure Evaluation (Signed)
Anesthesia Post Note  Patient: Margie Billetlexis S Liberatore  Procedure(s) Performed: AN AD HOC LABOR EPIDURAL  Patient location during evaluation: Mother Baby Anesthesia Type: Epidural Level of consciousness: awake, awake and alert and oriented Pain management: pain level controlled Vital Signs Assessment: post-procedure vital signs reviewed and stable Respiratory status: spontaneous breathing and nonlabored ventilation Cardiovascular status: blood pressure returned to baseline and stable Postop Assessment: no headache and no backache Anesthetic complications: no     Last Vitals:  Vitals:   03/28/18 2053 03/29/18 0028  BP: 117/80 108/67  Pulse: 69 64  Resp: 20 20  Temp: 36.8 C 36.7 C  SpO2: 100% 100%    Last Pain:  Vitals:   03/29/18 0028  TempSrc: Oral  PainSc:                  Ginger CarneStephanie Glenyce Randle

## 2018-03-30 LAB — RHOGAM INJECTION: UNIT DIVISION: 0

## 2018-03-30 LAB — SURGICAL PATHOLOGY

## 2018-03-30 MED ORDER — IBUPROFEN 600 MG PO TABS: 600.0000 mg | ORAL_TABLET | Freq: Four times a day (QID) | ORAL | 0 refills | Status: DC

## 2018-03-30 MED ORDER — IBUPROFEN 600 MG PO TABS
600.0000 mg | ORAL_TABLET | Freq: Four times a day (QID) | ORAL | Status: DC
  Filled 2018-03-30: qty 1

## 2018-03-30 MED ORDER — MEDROXYPROGESTERONE ACETATE 150 MG/ML IM SUSP: 150.0000 mg | INTRAMUSCULAR | 4 refills | Status: AC

## 2018-03-30 NOTE — Discharge Instructions (Signed)
Please start/keep taking your Synthroid hormone, and if you can't for some reason, please let us know so we can try to help! Having normal thyroid levels helps you be less tired and gives you energy, and you will need all of the energy you can get with a new born baby! I'd like to recheck your thyroid stimulation hormone levels (TSH levels) in a month at our office as well.  We will see you in the office in 4 weeks to check your TSH and check on your emotional state. Please call our office [phone] to make an appointment, and call with any questions or concerns. We are here to help!   Please call your doctor or return to the ER if you experience any chest pains, shortness of breath, dizziness, visual changes, fever greater than 101, any heavy bleeding (saturating more than 1 pad per hour), large clots, or foul smelling discharge, any worsening abdominal pain and cramping that is not controlled by pain medication, or any signs of postpartum depression. No tampons, enemas, douches, or sexual intercourse for 6 weeks. Also avoid tub baths, hot tubs, or swimming for 6 weeks.

## 2018-03-31 NOTE — Progress Notes (Signed)
Reviewed D/C instructions with pt and family. Pt verbalized understanding of teaching. Discharged to home via W/C. Pt to schedule f/u appt.  

## 2018-06-26 DIAGNOSIS — O99283 Endocrine, nutritional and metabolic diseases complicating pregnancy, third trimester: Secondary | ICD-10-CM | POA: Diagnosis not present

## 2018-06-26 DIAGNOSIS — G8929 Other chronic pain: Secondary | ICD-10-CM | POA: Diagnosis not present

## 2018-06-26 DIAGNOSIS — M5441 Lumbago with sciatica, right side: Secondary | ICD-10-CM | POA: Diagnosis not present

## 2018-06-26 DIAGNOSIS — R634 Abnormal weight loss: Secondary | ICD-10-CM | POA: Diagnosis not present

## 2018-09-26 DIAGNOSIS — N926 Irregular menstruation, unspecified: Secondary | ICD-10-CM | POA: Diagnosis not present

## 2018-12-17 ENCOUNTER — Emergency Department: Payer: Medicaid Other

## 2018-12-17 ENCOUNTER — Other Ambulatory Visit: Payer: Self-pay

## 2018-12-17 ENCOUNTER — Emergency Department
Admission: EM | Admit: 2018-12-17 | Discharge: 2018-12-17 | Disposition: A | Discharge status: Home / Self Care | Payer: Medicaid Other | Attending: Emergency Medicine | Admitting: Emergency Medicine

## 2018-12-17 DIAGNOSIS — F17210 Nicotine dependence, cigarettes, uncomplicated: Secondary | ICD-10-CM | POA: Insufficient documentation

## 2018-12-17 DIAGNOSIS — M542 Cervicalgia: Secondary | ICD-10-CM | POA: Insufficient documentation

## 2018-12-17 DIAGNOSIS — Z041 Encounter for examination and observation following transport accident: Secondary | ICD-10-CM | POA: Diagnosis not present

## 2018-12-17 DIAGNOSIS — S199XXA Unspecified injury of neck, initial encounter: Secondary | ICD-10-CM | POA: Diagnosis not present

## 2018-12-17 MED ORDER — KETOROLAC TROMETHAMINE 30 MG/ML IJ SOLN
30.0000 mg | Freq: Once | INTRAMUSCULAR | Status: AC
  Administered 2018-12-17: 16:00:00 30 mg via INTRAMUSCULAR
  Filled 2018-12-17: qty 1

## 2018-12-17 MED ORDER — METHOCARBAMOL 500 MG PO TABS: 500.0000 mg | ORAL_TABLET | Freq: Three times a day (TID) | ORAL | 0 refills | Status: AC | PRN

## 2018-12-17 MED ORDER — KETOROLAC TROMETHAMINE 10 MG PO TABS: 10.0000 mg | ORAL_TABLET | Freq: Four times a day (QID) | ORAL | 0 refills | Status: AC | PRN
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
## 2019-01-23 DIAGNOSIS — Z3042 Encounter for surveillance of injectable contraceptive: Secondary | ICD-10-CM | POA: Diagnosis not present

## 2019-01-23 DIAGNOSIS — J309 Allergic rhinitis, unspecified: Secondary | ICD-10-CM | POA: Diagnosis not present

## 2019-01-23 DIAGNOSIS — E039 Hypothyroidism, unspecified: Secondary | ICD-10-CM | POA: Diagnosis not present

## 2019-01-23 DIAGNOSIS — O99283 Endocrine, nutritional and metabolic diseases complicating pregnancy, third trimester: Secondary | ICD-10-CM | POA: Diagnosis not present

## 2019-01-23 DIAGNOSIS — R636 Underweight: Secondary | ICD-10-CM | POA: Diagnosis not present

## 2019-04-17 DIAGNOSIS — Z3042 Encounter for surveillance of injectable contraceptive: Secondary | ICD-10-CM | POA: Diagnosis not present

## 2019-04-17 DIAGNOSIS — G8929 Other chronic pain: Secondary | ICD-10-CM | POA: Diagnosis not present

## 2019-04-17 DIAGNOSIS — M5441 Lumbago with sciatica, right side: Secondary | ICD-10-CM | POA: Diagnosis not present

## 2019-08-12 DIAGNOSIS — Z3042 Encounter for surveillance of injectable contraceptive: Secondary | ICD-10-CM | POA: Diagnosis not present

## 2019-11-18 DIAGNOSIS — Z3042 Encounter for surveillance of injectable contraceptive: Secondary | ICD-10-CM | POA: Diagnosis not present

## 2019-11-19 ENCOUNTER — Emergency Department
Admission: EM | Admit: 2019-11-19 | Discharge: 2019-11-19 | Disposition: A | Discharge status: Home / Self Care | Payer: Medicaid Other | Attending: Emergency Medicine | Admitting: Emergency Medicine

## 2019-11-19 ENCOUNTER — Other Ambulatory Visit: Payer: Self-pay

## 2019-11-19 DIAGNOSIS — N739 Female pelvic inflammatory disease, unspecified: Secondary | ICD-10-CM | POA: Insufficient documentation

## 2019-11-19 DIAGNOSIS — F17210 Nicotine dependence, cigarettes, uncomplicated: Secondary | ICD-10-CM | POA: Diagnosis not present

## 2019-11-19 DIAGNOSIS — Z793 Long term (current) use of hormonal contraceptives: Secondary | ICD-10-CM | POA: Insufficient documentation

## 2019-11-19 DIAGNOSIS — R1084 Generalized abdominal pain: Secondary | ICD-10-CM | POA: Diagnosis present

## 2019-11-19 DIAGNOSIS — Z79899 Other long term (current) drug therapy: Secondary | ICD-10-CM | POA: Diagnosis not present

## 2019-11-19 DIAGNOSIS — N730 Acute parametritis and pelvic cellulitis: Secondary | ICD-10-CM | POA: Diagnosis not present

## 2019-11-19 LAB — CHLAMYDIA/NGC RT PCR (ARMC ONLY)
Chlamydia Tr: DETECTED — AB
N gonorrhoeae: DETECTED — AB

## 2019-11-19 LAB — COMPREHENSIVE METABOLIC PANEL
ALT: 11 U/L (ref 0–44)
AST: 13 U/L — ABNORMAL LOW (ref 15–41)
Albumin: 4.1 g/dL (ref 3.5–5.0)
Alkaline Phosphatase: 47 U/L (ref 38–126)
Anion gap: 8 (ref 5–15)
BUN: 10 mg/dL (ref 6–20)
CO2: 25 mmol/L (ref 22–32)
Calcium: 9.2 mg/dL (ref 8.9–10.3)
Chloride: 107 mmol/L (ref 98–111)
Creatinine, Ser: 0.7 mg/dL (ref 0.44–1.00)
GFR calc Af Amer: >60 mL/min (ref >60)
GFR calc non Af Amer: >60 mL/min (ref >60)
Glucose, Bld: 110 mg/dL — ABNORMAL HIGH (ref 70–99)
Potassium: 3.7 mmol/L (ref 3.5–5.1)
Sodium: 140 mmol/L (ref 135–145)
Total Bilirubin: 0.6 mg/dL (ref 0.3–1.2)
Total Protein: 7.2 g/dL (ref 6.5–8.1)

## 2019-11-19 LAB — CBC
HCT: 40 % (ref 36.0–46.0)
Hemoglobin: 13.3 g/dL (ref 12.0–15.0)
MCH: 33 pg (ref 26.0–34.0)
MCHC: 33.3 g/dL (ref 30.0–36.0)
MCV: 99.3 fL (ref 80.0–100.0)
Platelets: 172 10*3/uL (ref 150–400)
RBC: 4.03 MIL/uL (ref 3.87–5.11)
RDW: 13 % (ref 11.5–15.5)
WBC: 14.5 10*3/uL — ABNORMAL HIGH (ref 4.0–10.5)
nRBC: 0 % (ref 0.0–0.2)

## 2019-11-19 LAB — URINALYSIS, COMPLETE (UACMP) WITH MICROSCOPIC
Bilirubin Urine: NEGATIVE
Glucose, UA: NEGATIVE mg/dL
Ketones, ur: NEGATIVE mg/dL
Nitrite: POSITIVE — AB
Protein, ur: 100 mg/dL — AB
Specific Gravity, Urine: 1.013 (ref 1.005–1.030)
WBC, UA: >50 WBC/hpf — ABNORMAL HIGH (ref 0–5)
pH: 7 (ref 5.0–8.0)

## 2019-11-19 LAB — WET PREP, GENITAL
Sperm: NONE SEEN
Trich, Wet Prep: NONE SEEN
WBC, Wet Prep HPF POC: NONE SEEN
Yeast Wet Prep HPF POC: NONE SEEN

## 2019-11-19 LAB — POCT PREGNANCY, URINE: Preg Test, Ur: NEGATIVE

## 2019-11-19 LAB — LIPASE, BLOOD: Lipase: 16 U/L (ref 11–51)

## 2019-11-19 MED ORDER — SODIUM CHLORIDE 0.9 % IV SOLN
1.0000 g | Freq: Once | INTRAVENOUS | Status: AC
  Administered 2019-11-19: 1 g via INTRAVENOUS
  Filled 2019-11-19: qty 10

## 2019-11-19 MED ORDER — AZITHROMYCIN 500 MG PO TABS
1000.0000 mg | ORAL_TABLET | Freq: Once | ORAL | Status: AC
  Administered 2019-11-19: 18:00:00 1000 mg via ORAL
  Filled 2019-11-19: qty 2

## 2019-11-19 MED ORDER — DOXYCYCLINE HYCLATE 100 MG PO TBEC: 100.0000 mg | DELAYED_RELEASE_TABLET | Freq: Two times a day (BID) | ORAL | 0 refills | Status: AC

## 2019-11-19 MED ORDER — KETOROLAC TROMETHAMINE 30 MG/ML IJ SOLN
30.0000 mg | Freq: Once | INTRAMUSCULAR | Status: AC
  Administered 2019-11-19: 30 mg via INTRAVENOUS
  Filled 2019-11-19: qty 1

## 2019-11-19 MED ORDER — SODIUM CHLORIDE 0.9 % IV BOLUS
500.0000 mL | Freq: Once | INTRAVENOUS | Status: AC
  Administered 2019-11-19: 17:00:00 500 mL via INTRAVENOUS

## 2019-11-19 MED ORDER — HYDROCODONE-ACETAMINOPHEN 5-325 MG PO TABS
1.0000 | ORAL_TABLET | Freq: Once | ORAL | Status: AC
  Administered 2019-11-19: 1 via ORAL
  Filled 2019-11-19: qty 1

## 2019-11-19 MED ORDER — KETOROLAC TROMETHAMINE 10 MG PO TABS: 10.0000 mg | ORAL_TABLET | Freq: Four times a day (QID) | ORAL | 0 refills | Status: AC | PRN

## 2019-11-19 MED ORDER — METRONIDAZOLE 500 MG PO TABS: 500.0000 mg | ORAL_TABLET | Freq: Two times a day (BID) | ORAL | 0 refills | Status: AC

## 2019-11-19 MED ORDER — SODIUM CHLORIDE 0.9% FLUSH: 3.0000 mL | Freq: Once | INTRAVENOUS | Status: DC

## 2019-11-19 MED ORDER — ONDANSETRON 4 MG PO TBDP
4.0000 mg | ORAL_TABLET | Freq: Once | ORAL | Status: AC
  Administered 2019-11-19: 4 mg via ORAL
  Filled 2019-11-19: qty 1

## 2019-11-19 NOTE — ED Notes (Signed)
See triage note, pt reports right sided abdominal pain and lower back pain that started a few days ago. Denies fevers, n/v/d.  Denies burning or difficulty urination.

## 2019-11-19 NOTE — Discharge Instructions (Addendum)
No sex for 2 weeks. Take doxycycline and Flagyl for 2 weeks. Have repeat STD testing in 2 weeks. See health department for follow up.  Return to the emergency department with new or worsening symptoms. Remember, no alcohol with Flagyl.

## 2019-11-19 NOTE — ED Triage Notes (Signed)
Pt c/o right lower back pain that radiates around to the RLQ for the past couple of days. Denies N/V/D.Allison Castro

## 2019-11-19 NOTE — ED Provider Notes (Signed)
Emergency Department Provider Note  ____________________________________________  Time seen: Approximately 5:03 PM  I have reviewed the triage vital signs and the nursing notes.   HISTORY  Chief Complaint Abdominal Pain   Historian Patient     HPI Allison Castro is a 19 y.o. female presents to the emergency department with diffuse abdominal discomfort that has occurred for the past 2 to 3 days but acutely worsened today.  Patient denies fever, chills or nausea at home.  She denies dysuria, hematuria or increased urinary frequency.  Patient states that she recently had unprotected sex with a new partner.  She has had no changes in vaginal discharge or dyspareunia.  No abdominal falls or traumas.   Past Medical History:  Diagnosis Date  . Hypothyroidism affecting pregnancy      Immunizations up to date:  Yes.     Past Medical History:  Diagnosis Date  . Hypothyroidism affecting pregnancy     Patient Active Problem List   Diagnosis Date Noted  . Uterine contractions during pregnancy 03/27/2018  . Labor and delivery indication for care or intervention 03/27/2018    Past Surgical History:  Procedure Laterality Date  . TYMPANOSTOMY TUBE PLACEMENT      Prior to Admission medications   Medication Sig Start Date End Date Taking? Authorizing Provider  doxycycline (DORYX) 100 MG EC tablet Take 1 tablet (100 mg total) by mouth 2 (two) times daily for 14 days. 11/19/19 12/03/19  Orvil Feil, PA-C  ferrous sulfate 325 (65 FE) MG tablet Take 1 tablet (325 mg total) by mouth 2 (two) times daily with a meal. 03/27/18   Genia Del, CNM  ibuprofen (ADVIL,MOTRIN) 600 MG tablet Take 1 tablet (600 mg total) by mouth every 6 (six) hours. 03/30/18   Christeen Douglas, MD  ketorolac (TORADOL) 10 MG tablet Take 1 tablet (10 mg total) by mouth every 6 (six) hours as needed for up to 5 days. 11/19/19 11/24/19  Orvil Feil, PA-C  levothyroxine (SYNTHROID) 175 MCG tablet Take 1  tablet (175 mcg total) by mouth daily before breakfast. 03/27/18   Genia Del, CNM  medroxyPROGESTERone (DEPO-PROVERA) 150 MG/ML injection Inject 1 mL (150 mg total) into the muscle every 3 (three) months. 03/30/18   Christeen Douglas, MD  metroNIDAZOLE (FLAGYL) 500 MG tablet Take 1 tablet (500 mg total) by mouth 2 (two) times daily for 14 days. 11/19/19 12/03/19  Orvil Feil, PA-C  vitamin C (ASCORBIC ACID) 500 MG tablet Take twice daily with iron supplement 03/27/18   Genia Del, CNM    Allergies Patient has no known allergies.  Family History  Problem Relation Age of Onset  . Ovarian cancer Maternal Grandmother   . Heart disease Paternal Grandfather   . Thyroid disease Father     Social History Social History   Tobacco Use  . Smoking status: Current Every Day Smoker    Packs/day: 0.50    Years: 3.00    Pack years: 1.50    Types: Cigarettes    Last attempt to quit: 03/06/2018    Years since quitting: 1.7  . Smokeless tobacco: Never Used  Substance Use Topics  . Alcohol use: No  . Drug use: Yes    Types: Marijuana     Review of Systems  Constitutional: No fever/chills Eyes:  No discharge ENT: No upper respiratory complaints. Respiratory: no cough. No SOB/ use of accessory muscles to breath Gastrointestinal: Patient has abdominal discomfort.  Musculoskeletal: Negative for musculoskeletal pain. Skin: Negative for rash,  abrasions, lacerations, ecchymosis.    ____________________________________________   PHYSICAL EXAM:  VITAL SIGNS: ED Triage Vitals  Enc Vitals Group     BP 11/19/19 1445 130/85     Pulse Rate 11/19/19 1445 94     Resp 11/19/19 1445 18     Temp 11/19/19 1445 98.4 F (36.9 C)     Temp Source 11/19/19 1445 Oral     SpO2 11/19/19 1445 100 %     Weight 11/19/19 1446 70 lb (31.8 kg)     Height 11/19/19 1446 5' (1.524 m)     Head Circumference --      Peak Flow --      Pain Score 11/19/19 1448 8     Pain Loc --      Pain Edu? --       Excl. in GC? --      Constitutional: Alert and oriented. Well appearing and in no acute distress. Eyes: Conjunctivae are normal. PERRL. EOMI. Head: Atraumatic. ENT:      Ears: TMs are pearly.       Nose: No congestion/rhinnorhea.      Mouth/Throat: Mucous membranes are moist.  Neck: No stridor.  No cervical spine tenderness to palpation.  Cardiovascular: Normal rate, regular rhythm. Normal S1 and S2.  Good peripheral circulation. Respiratory: Normal respiratory effort without tachypnea or retractions. Lungs CTAB. Good air entry to the bases with no decreased or absent breath sounds Gastrointestinal: Bowel sounds x 4 quadrants. Soft and nontender to palpation. No guarding or rigidity. No distention. Musculoskeletal: Full range of motion to all extremities. No obvious deformities noted Neurologic:  Normal for age. No gross focal neurologic deficits are appreciated.  Skin:  Skin is warm, dry and intact. No rash noted. Psychiatric: Mood and affect are normal for age. Speech and behavior are normal.   ____________________________________________   LABS (all labs ordered are listed, but only abnormal results are displayed)  Labs Reviewed  WET PREP, GENITAL - Abnormal; Notable for the following components:      Result Value   Clue Cells Wet Prep HPF POC FEW (*)    All other components within normal limits  CHLAMYDIA/NGC RT PCR ([facility] ONLY) - Abnormal; Notable for the following components:   Chlamydia Tr DETECTED (*)    N gonorrhoeae DETECTED (*)    All other components within normal limits  COMPREHENSIVE METABOLIC PANEL - Abnormal; Notable for the following components:   Glucose, Bld 110 (*)    AST 13 (*)    All other components within normal limits  CBC - Abnormal; Notable for the following components:   WBC 14.5 (*)    All other components within normal limits  URINALYSIS, COMPLETE (UACMP) WITH MICROSCOPIC - Abnormal; Notable for the following components:   Color,  Urine YELLOW (*)    APPearance CLOUDY (*)    Hgb urine dipstick SMALL (*)    Protein, ur 100 (*)    Nitrite POSITIVE (*)    Leukocytes,Ua LARGE (*)    WBC, UA >50 (*)    Bacteria, UA MANY (*)    All other components within normal limits  URINE CULTURE  LIPASE, BLOOD  POC URINE PREG, ED  POCT PREGNANCY, URINE   ____________________________________________  EKG   ____________________________________________  RADIOLOGY   No results found.  ____________________________________________    PROCEDURES  Procedure(s) performed:     Procedures     Medications  sodium chloride flush (NS) 0.9 % injection 3 mL (3 mLs Intravenous Not Given  11/19/19 1605)  sodium chloride 0.9 % bolus 500 mL (0 mLs Intravenous Stopped 11/19/19 1805)  cefTRIAXone (ROCEPHIN) 1 g in sodium chloride 0.9 % 100 mL IVPB (0 g Intravenous Stopped 11/19/19 1805)  ketorolac (TORADOL) 30 MG/ML injection 30 mg (30 mg Intravenous Given 11/19/19 1637)  HYDROcodone-acetaminophen (NORCO/VICODIN) 5-325 MG per tablet 1 tablet (1 tablet Oral Given 11/19/19 1805)  ondansetron (ZOFRAN-ODT) disintegrating tablet 4 mg (4 mg Oral Given 11/19/19 1805)  azithromycin (ZITHROMAX) tablet 1,000 mg (1,000 mg Oral Given 11/19/19 1826)     ____________________________________________   INITIAL IMPRESSION / ASSESSMENT AND PLAN / ED COURSE  Pertinent labs & imaging results that were available during my care of the patient were reviewed by me and considered in my medical decision making (see chart for details).    Assessment and Plan: Abdominal discomfort 19 year old female presents to the emergency department with diffuse abdominal discomfort for the past 2 to 3 days.  Vital signs are reassuring at triage.  On physical exam, patient had no abdominal tenderness to palpation and no guarding.  Differential diagnosis includes cystitis, STD, pyelonephritis...  Urinalysis is concerning for cystitis with nitrates, a large amount  of leuks and many bacteria.  CBC indicates leukocytosis.  Will obtain gonorrhea and Chlamydia testing.  Will administer supplemental fluids and Toradol and will reassess.  Patient tested positive for gonorrhea and chlamydia in the emergency department.  Given leukocytosis, cervical motion tenderness and urinalysis findings, there is concern for PID.  Patient was given Rocephin and azithromycin in the emergency department.  She was discharged with doxycycline and Flagyl for the next 14 days.  She was advised to abstain from sex for the next 2 weeks and to have repeat STD testing at local health department.  She was cautioned to return to the emergency department with new or worsening symptoms.   ____________________________________________  FINAL CLINICAL IMPRESSION(S) / ED DIAGNOSES  Final diagnoses:  PID (acute pelvic inflammatory disease)      NEW MEDICATIONS STARTED DURING THIS VISIT:  ED Discharge Orders         Ordered    doxycycline (DORYX) 100 MG EC tablet  2 times daily     11/19/19 1819    metroNIDAZOLE (FLAGYL) 500 MG tablet  2 times daily     11/19/19 1819    ketorolac (TORADOL) 10 MG tablet  Every 6 hours PRN     11/19/19 1820              This chart was dictated using voice recognition software/Dragon. Despite best efforts to proofread, errors can occur which can change the meaning. Any change was purely unintentional.     Karren Cobble 11/19/19 2131    Carrie Mew, MD 11/20/19 [phone]

## 2019-11-21 ENCOUNTER — Telehealth: Payer: Self-pay | Admitting: Emergency Medicine

## 2019-11-21 LAB — URINE CULTURE: Culture: NO GROWTH

## 2019-11-21 NOTE — Telephone Encounter (Signed)
Patient called and says she is having trouble keeping down the flagyl pills and that she vomits after taking them.  Per dr Roxan Hockey can call in zofran 4 mg odt take 1 every 6 hours as needed for nausea with quantity of 12.  Med called to [facility] per patient request.

## 2020-01-20 ENCOUNTER — Emergency Department: Payer: Medicaid Other

## 2020-01-20 ENCOUNTER — Encounter: Payer: Self-pay | Admitting: Emergency Medicine

## 2020-01-20 ENCOUNTER — Other Ambulatory Visit: Payer: Self-pay

## 2020-01-20 ENCOUNTER — Emergency Department
Admission: EM | Admit: 2020-01-20 | Discharge: 2020-01-20 | Disposition: A | Discharge status: Home / Self Care | Payer: Medicaid Other | Attending: Emergency Medicine | Admitting: Emergency Medicine

## 2020-01-20 DIAGNOSIS — S34139A Unspecified injury to sacral spinal cord, initial encounter: Secondary | ICD-10-CM | POA: Diagnosis present

## 2020-01-20 DIAGNOSIS — Y9389 Activity, other specified: Secondary | ICD-10-CM | POA: Insufficient documentation

## 2020-01-20 DIAGNOSIS — S3992XA Unspecified injury of lower back, initial encounter: Secondary | ICD-10-CM | POA: Diagnosis not present

## 2020-01-20 DIAGNOSIS — W182XXA Fall in (into) shower or empty bathtub, initial encounter: Secondary | ICD-10-CM | POA: Insufficient documentation

## 2020-01-20 DIAGNOSIS — S300XXA Contusion of lower back and pelvis, initial encounter: Secondary | ICD-10-CM | POA: Diagnosis not present

## 2020-01-20 DIAGNOSIS — Y9289 Other specified places as the place of occurrence of the external cause: Secondary | ICD-10-CM | POA: Insufficient documentation

## 2020-01-20 DIAGNOSIS — Y998 Other external cause status: Secondary | ICD-10-CM | POA: Diagnosis not present

## 2020-01-20 DIAGNOSIS — F17210 Nicotine dependence, cigarettes, uncomplicated: Secondary | ICD-10-CM | POA: Insufficient documentation

## 2020-01-20 LAB — POCT PREGNANCY, URINE: Preg Test, Ur: NEGATIVE

## 2020-01-20 MED ORDER — TRAMADOL HCL 50 MG PO TABS: 50.0000 mg | ORAL_TABLET | Freq: Four times a day (QID) | ORAL | 0 refills | Status: AC | PRN

## 2020-01-20 MED ORDER — ONDANSETRON 4 MG PO TBDP
4.0000 mg | ORAL_TABLET | Freq: Once | ORAL | Status: DC
  Filled 2020-01-20: qty 1

## 2020-01-20 MED ORDER — IBUPROFEN 800 MG PO TABS: 800.0000 mg | ORAL_TABLET | Freq: Three times a day (TID) | ORAL | 0 refills | Status: DC | PRN

## 2020-01-20 MED ORDER — HYDROCODONE-ACETAMINOPHEN 5-325 MG PO TABS
1.0000 | ORAL_TABLET | Freq: Once | ORAL | Status: AC
  Administered 2020-01-20: 1 via ORAL
  Filled 2020-01-20: qty 1

## 2020-01-20 MED ORDER — MELOXICAM 15 MG PO TABS: 15.0000 mg | ORAL_TABLET | Freq: Every day | ORAL | 1 refills | Status: AC
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
## 2020-01-20 NOTE — ED Provider Notes (Signed)
Emergency Department Provider Note  ____________________________________________  Time seen: Approximately 3:05 PM  I have reviewed the triage vital signs and the nursing notes.   HISTORY  Chief Complaint Back Pain   Historian Patient     HPI Allison Castro is a 19 y.o. female presents to the emergency department with low back pain and "tailbone pain" after patient had a mechanical, nonsyncopal fall while stepping out of her bathtub.  She denies hitting her head.  She denies neck pain.  No numbness or tingling in the upper and lower extremities.  No chest pain, chest tightness or abdominal pain.  She has been able to ambulate since fall occurred.  No abrasions or lacerations.  No erythema over the affected area.   Past Medical History:  Diagnosis Date  . Hypothyroidism affecting pregnancy      Immunizations up to date:  Yes.     Past Medical History:  Diagnosis Date  . Hypothyroidism affecting pregnancy     Patient Active Problem List   Diagnosis Date Noted  . Uterine contractions during pregnancy 03/27/2018  . Labor and delivery indication for care or intervention 03/27/2018    Past Surgical History:  Procedure Laterality Date  . TYMPANOSTOMY TUBE PLACEMENT      Prior to Admission medications   Medication Sig Start Date End Date Taking? Authorizing Provider  ferrous sulfate 325 (65 FE) MG tablet Take 1 tablet (325 mg total) by mouth 2 (two) times daily with a meal. 03/27/18   Genia Del, CNM  levothyroxine (SYNTHROID) 175 MCG tablet Take 1 tablet (175 mcg total) by mouth daily before breakfast. 03/27/18   Genia Del, CNM  medroxyPROGESTERone (DEPO-PROVERA) 150 MG/ML injection Inject 1 mL (150 mg total) into the muscle every 3 (three) months. 03/30/18   Christeen Douglas, MD  meloxicam (MOBIC) 15 MG tablet Take 1 tablet (15 mg total) by mouth daily for 7 days. 01/20/20 01/27/20  Orvil Feil, PA-C  traMADol (ULTRAM) 50 MG tablet Take 1 tablet (50  mg total) by mouth every 6 (six) hours as needed for up to 3 days. 01/20/20 01/23/20  Orvil Feil, PA-C  vitamin C (ASCORBIC ACID) 500 MG tablet Take twice daily with iron supplement 03/27/18   Genia Del, CNM    Allergies Patient has no known allergies.  Family History  Problem Relation Age of Onset  . Ovarian cancer Maternal Grandmother   . Heart disease Paternal Grandfather   . Thyroid disease Father     Social History Social History   Tobacco Use  . Smoking status: Current Every Day Smoker    Packs/day: 0.50    Years: 3.00    Pack years: 1.50    Types: Cigarettes    Last attempt to quit: 03/06/2018    Years since quitting: 1.8  . Smokeless tobacco: Never Used  Vaping Use  . Vaping Use: Every day  Substance Use Topics  . Alcohol use: No  . Drug use: Yes    Types: Marijuana     Review of Systems  Constitutional: No fever/chills Eyes:  No discharge ENT: No upper respiratory complaints. Respiratory: no cough. No SOB/ use of accessory muscles to breath Gastrointestinal:   No nausea, no vomiting.  No diarrhea.  No constipation. Musculoskeletal: Patient has sacral and low back pain.  Skin: Negative for rash, abrasions, lacerations, ecchymosis.   ____________________________________________   PHYSICAL EXAM:  VITAL SIGNS: ED Triage Vitals  Enc Vitals Group     BP 01/20/20 1320 126/86  Pulse Rate 01/20/20 1320 87     Resp 01/20/20 1320 16     Temp 01/20/20 1320 97.8 F (36.6 C)     Temp Source 01/20/20 1320 Oral     SpO2 01/20/20 1320 98 %     Weight 01/20/20 1321 80 lb (36.3 kg)     Height 01/20/20 1321 5' (1.524 m)     Head Circumference --      Peak Flow --      Pain Score 01/20/20 1321 9     Pain Loc --      Pain Edu? --      Excl. in [place]? --      Constitutional: Alert and oriented. Well appearing and in no acute distress. Eyes: Conjunctivae are normal. PERRL. EOMI. Head: Atraumatic. ENT:      Nose: No congestion/rhinnorhea.       Mouth/Throat: Mucous membranes are moist.  Neck: No stridor.  No cervical spine tenderness to palpation. Cardiovascular: Normal rate, regular rhythm. Normal S1 and S2.  Good peripheral circulation. Respiratory: Normal respiratory effort without tachypnea or retractions. Lungs CTAB. Good air entry to the bases with no decreased or absent breath sounds Gastrointestinal: Bowel sounds x 4 quadrants. Soft and nontender to palpation. No guarding or rigidity. No distention. Musculoskeletal: Full range of motion to all extremities. No obvious deformities noted.  Patient has reproducible tenderness with palpation over the midline lumbar spine L4-L5 and along sacrum.  Negative straight leg raise bilaterally.  Symmetric strength in the lower extremities. Neurologic:  Normal for age. No gross focal neurologic deficits are appreciated.  Skin:  Skin is warm, dry and intact. No rash noted. Psychiatric: Mood and affect are normal for age. Speech and behavior are normal.   ____________________________________________   LABS (all labs ordered are listed, but only abnormal results are displayed)  Labs Reviewed  POC URINE PREG, ED  POCT PREGNANCY, URINE   ____________________________________________  EKG   ____________________________________________  RADIOLOGY I, Lannie Fields, personally viewed and evaluated these images (plain radiographs) as part of my medical decision making, as well as reviewing the written report by the radiologist.    DG Lumbar Spine 2-3 Views  Result Date: 01/20/2020 CLINICAL DATA:  19 year old female with fall and back pain. EXAM: LUMBAR SPINE - 2-3 VIEW; SACRUM AND COCCYX - 2+ VIEW COMPARISON:  None. FINDINGS: Five lumbar type vertebra. There is no acute fracture or subluxation the lumbar spine. The vertebral body heights and disc spaces are maintained. The visualized posterior elements are intact. There is grade 1 L5-S1 retrolisthesis. The neural foramina are patent as  visualized. No acute fracture or dislocation of the sacrum. IMPRESSION: Negative. Electronically Signed   By: Anner Crete M.D.   On: 01/20/2020 16:54   DG Sacrum/Coccyx  Result Date: 01/20/2020 CLINICAL DATA:  19 year old female with fall and back pain. EXAM: LUMBAR SPINE - 2-3 VIEW; SACRUM AND COCCYX - 2+ VIEW COMPARISON:  None. FINDINGS: Five lumbar type vertebra. There is no acute fracture or subluxation the lumbar spine. The vertebral body heights and disc spaces are maintained. The visualized posterior elements are intact. There is grade 1 L5-S1 retrolisthesis. The neural foramina are patent as visualized. No acute fracture or dislocation of the sacrum. IMPRESSION: Negative. Electronically Signed   By: Anner Crete M.D.   On: 01/20/2020 16:54    ____________________________________________    PROCEDURES  Procedure(s) performed:     Procedures     Medications  ondansetron (ZOFRAN-ODT) disintegrating tablet 4 mg (4  mg Oral Not Given 01/20/20 1531)  HYDROcodone-acetaminophen (NORCO/VICODIN) 5-325 MG per tablet 1 tablet (1 tablet Oral Given 01/20/20 1529)     ____________________________________________   INITIAL IMPRESSION / ASSESSMENT AND PLAN / ED COURSE  Pertinent labs & imaging results that were available during my care of the patient were reviewed by me and considered in my medical decision making (see chart for details).    Assessment and plan Fall 19 year old female presents to the emergency department after mechanical fall complaining of low back pain and sacral pain.  Vital signs were reassuring in triage.  On physical exam, patient had reproducible tenderness to palpation over L4-L5 and along sacrum.  Will obtain x-rays of the lumbar spine and sacrum and will reassess.  Norco was given in the emergency department for pain.  No bony abnormality was visualized on x-rays of the lumbar spine and sacrum.  Patient was given Norco in the emergency department for  pain.  She was discharged with a short course of tramadol.  Return precautions were given to return with new or worsening symptoms.    ____________________________________________  FINAL CLINICAL IMPRESSION(S) / ED DIAGNOSES  Final diagnoses:  Contusion of sacrum, initial encounter      NEW MEDICATIONS STARTED DURING THIS VISIT:  ED Discharge Orders         Ordered    traMADol (ULTRAM) 50 MG tablet  Every 6 hours PRN     Discontinue  Reprint     01/20/20 1733    meloxicam (MOBIC) 15 MG tablet  Daily     Discontinue  Reprint     01/20/20 1733              This chart was dictated using voice recognition software/Dragon. Despite best efforts to proofread, errors can occur which can change the meaning. Any change was purely unintentional.     Orvil Feil, PA-C 01/20/20 1741    Dionne Bucy, MD 01/22/20 [id number]

## 2020-01-20 NOTE — ED Notes (Signed)
Pt given crackers, peanut butter and juice. Mom asking to speak with md. Pt asking if she can walk. Advised pt if pain is worse laying or sitting that she may stand but they could not be in the hallway.PA notified that mother wants to speak with her. PA states they have discussed the concerns.

## 2020-01-28 DIAGNOSIS — M533 Sacrococcygeal disorders, not elsewhere classified: Secondary | ICD-10-CM | POA: Diagnosis not present

## 2020-01-28 DIAGNOSIS — M545 Low back pain: Secondary | ICD-10-CM | POA: Diagnosis not present

## 2020-04-19 ENCOUNTER — Ambulatory Visit: Payer: Medicaid Other

## 2020-04-20 DIAGNOSIS — Z113 Encounter for screening for infections with a predominantly sexual mode of transmission: Secondary | ICD-10-CM | POA: Diagnosis not present

## 2020-04-20 DIAGNOSIS — E039 Hypothyroidism, unspecified: Secondary | ICD-10-CM | POA: Diagnosis not present

## 2020-04-20 DIAGNOSIS — N912 Amenorrhea, unspecified: Secondary | ICD-10-CM | POA: Diagnosis not present

## 2020-04-20 DIAGNOSIS — Z1159 Encounter for screening for other viral diseases: Secondary | ICD-10-CM | POA: Diagnosis not present

## 2020-04-27 DIAGNOSIS — R1012 Left upper quadrant pain: Secondary | ICD-10-CM | POA: Diagnosis not present

## 2020-04-27 DIAGNOSIS — Z862 Personal history of diseases of the blood and blood-forming organs and certain disorders involving the immune mechanism: Secondary | ICD-10-CM | POA: Diagnosis not present

## 2020-09-14 ENCOUNTER — Emergency Department
Admission: EM | Admit: 2020-09-14 | Discharge: 2020-09-14 | Discharge status: Against Medical Advice | Payer: No Typology Code available for payment source

## 2021-01-26 DIAGNOSIS — R439 Unspecified disturbances of smell and taste: Secondary | ICD-10-CM | POA: Diagnosis not present

## 2021-01-26 DIAGNOSIS — Z20822 Contact with and (suspected) exposure to covid-19: Secondary | ICD-10-CM | POA: Diagnosis not present

## 2021-01-26 DIAGNOSIS — R059 Cough, unspecified: Secondary | ICD-10-CM | POA: Diagnosis not present

## 2021-08-17 ENCOUNTER — Emergency Department
Admission: EM | Admit: 2021-08-17 | Discharge: 2021-08-17 | Disposition: A | Discharge status: Home / Self Care | Payer: Medicaid Other | Attending: Emergency Medicine | Admitting: Emergency Medicine

## 2021-08-17 ENCOUNTER — Emergency Department
Admission: EM | Admit: 2021-08-17 | Discharge: 2021-08-17 | Discharge status: Against Medical Advice | Payer: No Typology Code available for payment source

## 2021-08-17 ENCOUNTER — Emergency Department: Payer: Medicaid Other

## 2021-08-17 ENCOUNTER — Other Ambulatory Visit: Payer: Self-pay

## 2021-08-17 DIAGNOSIS — R110 Nausea: Secondary | ICD-10-CM | POA: Insufficient documentation

## 2021-08-17 DIAGNOSIS — R079 Chest pain, unspecified: Secondary | ICD-10-CM | POA: Diagnosis not present

## 2021-08-17 DIAGNOSIS — R0602 Shortness of breath: Secondary | ICD-10-CM | POA: Diagnosis not present

## 2021-08-17 DIAGNOSIS — R1013 Epigastric pain: Secondary | ICD-10-CM | POA: Diagnosis not present

## 2021-08-17 DIAGNOSIS — R1011 Right upper quadrant pain: Secondary | ICD-10-CM | POA: Diagnosis not present

## 2021-08-17 DIAGNOSIS — E039 Hypothyroidism, unspecified: Secondary | ICD-10-CM | POA: Diagnosis not present

## 2021-08-17 DIAGNOSIS — R0789 Other chest pain: Secondary | ICD-10-CM | POA: Diagnosis not present

## 2021-08-17 LAB — BASIC METABOLIC PANEL
Anion gap: 8 (ref 5–15)
BUN: 11 mg/dL (ref 6–20)
CO2: 29 mmol/L (ref 22–32)
Calcium: 9 mg/dL (ref 8.9–10.3)
Chloride: 98 mmol/L (ref 98–111)
Creatinine, Ser: 0.64 mg/dL (ref 0.44–1.00)
GFR, Estimated: >60 mL/min (ref >60)
Glucose, Bld: 97 mg/dL (ref 70–99)
Potassium: 3.5 mmol/L (ref 3.5–5.1)
Sodium: 135 mmol/L (ref 135–145)

## 2021-08-17 LAB — CBC
HCT: 38.2 % (ref 36.0–46.0)
Hemoglobin: 12.6 g/dL (ref 12.0–15.0)
MCH: 32.7 pg (ref 26.0–34.0)
MCHC: 33 g/dL (ref 30.0–36.0)
MCV: 99.2 fL (ref 80.0–100.0)
Platelets: 157 10*3/uL (ref 150–400)
RBC: 3.85 MIL/uL — ABNORMAL LOW (ref 3.87–5.11)
RDW: 12.1 % (ref 11.5–15.5)
WBC: 7.4 10*3/uL (ref 4.0–10.5)
nRBC: 0 % (ref 0.0–0.2)

## 2021-08-17 LAB — HEPATIC FUNCTION PANEL
ALT: 9 U/L (ref 0–44)
AST: 16 U/L (ref 15–41)
Albumin: 4.3 g/dL (ref 3.5–5.0)
Alkaline Phosphatase: 60 U/L (ref 38–126)
Bilirubin, Direct: <0.1 mg/dL (ref 0.0–0.2)
Total Bilirubin: 0.5 mg/dL (ref 0.3–1.2)
Total Protein: 7.1 g/dL (ref 6.5–8.1)

## 2021-08-17 LAB — LIPASE, BLOOD: Lipase: 25 U/L (ref 11–51)

## 2021-08-17 LAB — POC URINE PREG, ED: Preg Test, Ur: NEGATIVE

## 2021-08-17 LAB — TROPONIN I (HIGH SENSITIVITY): Troponin I (High Sensitivity): <2 ng/L (ref <18)

## 2021-08-17 MED ORDER — PANTOPRAZOLE SODIUM 40 MG PO TBEC: 40.0000 mg | DELAYED_RELEASE_TABLET | Freq: Every day | ORAL | 0 refills | Status: AC

## 2021-08-17 MED ORDER — SUCRALFATE 1 G PO TABS
1.0000 g | ORAL_TABLET | Freq: Once | ORAL | Status: AC
  Administered 2021-08-17: 1 g via ORAL
  Filled 2021-08-17: qty 1

## 2021-08-17 MED ORDER — SUCRALFATE 1 G PO TABS
1.0000 g | ORAL_TABLET | Freq: Four times a day (QID) | ORAL | 0 refills | Status: AC
Start: 2021-08-17 — End: 2021-08-22

## 2021-08-17 MED ORDER — PANTOPRAZOLE SODIUM 40 MG PO TBEC
40.0000 mg | DELAYED_RELEASE_TABLET | Freq: Once | ORAL | Status: AC
  Administered 2021-08-17: 40 mg via ORAL
  Filled 2021-08-17: qty 1

## 2021-08-17 MED ORDER — ALUM & MAG HYDROXIDE-SIMETH 200-200-20 MG/5ML PO SUSP
30.0000 mL | Freq: Once | ORAL | Status: AC
Start: 2021-08-17 — End: 2021-08-17
  Administered 2021-08-17: 30 mL via ORAL
  Filled 2021-08-17: qty 30

## 2021-08-17 MED ORDER — OXYCODONE-ACETAMINOPHEN 5-325 MG PO TABS
1.0000 | ORAL_TABLET | Freq: Once | ORAL | Status: AC
  Administered 2021-08-17: 1 via ORAL
  Filled 2021-08-17: qty 1

## 2021-08-17 NOTE — ED Notes (Signed)
Pt left facility without telling RN staff.  Pt dismissed off board.

## 2021-08-17 NOTE — ED Triage Notes (Signed)
Pt comes with c/o CP and Sob. Pt states 7/10 pain. Pt states this started two days ago. Pt states it has gotten worse today. Pt states epigastric pain.  Pt denies any N/V/D

## 2021-08-17 NOTE — ED Provider Notes (Signed)
[facility] Provider Note    Event Date/Time   First MD Initiated Contact with Patient 08/17/21 1222     (approximate)   History   Chest Pain   HPI  Allison Castro is a 21 y.o. female with medical history of hypothyroidism and vaping who presents for assessment of 2 days of some waxing and waning but worsening right upper quadrant pain.  Patient states it takes her breath away and positive for little short of breath.  Rates towards her back and up into her chest.  She has some nausea yesterday but has not had any vomiting or diarrhea.  She denies any urinary symptoms, lower abdominal pain, left side abdominal pain, back pain or other chest pains.  She has not had any headache, earache, sore throat, fevers and does not drink alcohol regularly or use any significant mount of NSAIDs.  No known history of peptic ulcer disease or gastritis and patient still has her gallbladder.    Past Medical History:  Diagnosis Date   Hypothyroidism affecting pregnancy      Physical Exam  Triage Vital Signs: ED Triage Vitals  Enc Vitals Group     BP 08/17/21 1125 116/67     Pulse Rate 08/17/21 1125 71     Resp 08/17/21 1125 18     Temp 08/17/21 1125 (!) 97.4 F (36.3 C)     Temp src --      SpO2 08/17/21 1125 95 %     Weight --      Height --      Head Circumference --      Peak Flow --      Pain Score 08/17/21 1123 7     Pain Loc --      Pain Edu? --      Excl. in [place]? --     Most recent vital signs: Vitals:   08/17/21 1125 08/17/21 1347  BP: 116/67 107/76  Pulse: 71 75  Resp: 18 18  Temp: (!) 97.4 F (36.3 C) 97.6 F (36.4 C)  SpO2: 95% 98%    General: Awake, no distress.  CV:  Good peripheral perfusion.  Resp:  Normal effort.  Abd:  No distention.  Tender in the right upper quadrant with positive Murphy sign.  There is also some mild tenderness in the epigastrium Other:     ED Results / Procedures / Treatments  Labs (all labs ordered are  listed, but only abnormal results are displayed) Labs Reviewed  CBC - Abnormal; Notable for the following components:      Result Value   RBC 3.85 (*)    All other components within normal limits  BASIC METABOLIC PANEL  HEPATIC FUNCTION PANEL  LIPASE, BLOOD  POC URINE PREG, ED  TROPONIN I (HIGH SENSITIVITY)     EKG  EKG remarkable for sinus rhythm with a ventricular rate of 61, slight right axis deviation without evidence of acute anemia or significant arrhythmia.   RADIOLOGY  Chest x-ray reviewed by myself shows no focal consolidation, effusion, edema, pneumothorax or other clear acute thoracic process.  I also reviewed radiology's interpretation and agree with the findings.  Right upper quadrant ultrasound ordered and reviewed by myself shows no evidence of cholelithiasis, cholecystitis, dilation of the common bile duct or any other acute process.  Portal vein is patent on Doppler imaging.  I also reviewed radiology's findings and agree with their interpretation.  PROCEDURES:    MEDICATIONS ORDERED IN ED: Medications  oxyCODONE-acetaminophen (PERCOCET/ROXICET) 5-325 MG per tablet 1 tablet (1 tablet Oral Given 08/17/21 1240)  alum & mag hydroxide-simeth (MAALOX/MYLANTA) 200-200-20 MG/5ML suspension 30 mL (30 mLs Oral Given 08/17/21 1241)  sucralfate (CARAFATE) tablet 1 g (1 g Oral Given 08/17/21 1240)  pantoprazole (PROTONIX) EC tablet 40 mg (40 mg Oral Given 08/17/21 1240)     IMPRESSION / MDM / ASSESSMENT AND PLAN / ED COURSE  I reviewed the triage vital signs and the nursing notes.                              Differential diagnosis includes, but is not limited to atypical presentation for ACS, pneumonia, PE, peptic ulcer disease, cholecystitis, symptomatic cholelithiasis, pancreatitis, and gastritis.  Patient is PERC negative and is Evalose patient for PE.  She states she has not been on any birth control for over a year.  EKG remarkable for sinus rhythm with a  ventricular rate of 61, slight right axis deviation without evidence of acute anemia or significant arrhythmia.  Chest x-ray reviewed by myself shows no focal consolidation, effusion, edema, pneumothorax or other clear acute thoracic process.  I also reviewed radiology's interpretation and agree with the findings.   BMP without any significant electrolyte or metabolic derangements.  CBC without leukocytosis or acute anemia.  Urine pregnancy test is negative.  Troponin undetectable and overall I have low suspicion for ACS or myocarditis at this time.  Lipase not consistent with acute pancreatitis.  Hepatic function panel without evidence of hepatitis or cholestatic process  Right upper quadrant ultrasound ordered and reviewed by myself shows no evidence of cholelithiasis, cholecystitis, dilation of the common bile duct or any other acute process.  Portal vein is patent on Doppler imaging.  I also reviewed radiology's findings and agree with their interpretation.  Suspect likely gastritis versus peptic ulcer disease.  Patient states she is feeling much better after GI cocktail.  Will trial outpatient course of Protonix and Carafate.  Discussed importance of close outpatient follow-up and cessation of vaping.  She will return immediately for any new or worsening of symptoms.  Discharged in stable condition.  Strict return precautions advised and discussed.   FINAL CLINICAL IMPRESSION(S) / ED DIAGNOSES   Final diagnoses:  RUQ abdominal pain  Epigastric pain     Rx / DC Orders   ED Discharge Orders          Ordered    pantoprazole (PROTONIX) 40 MG tablet  Daily        08/17/21 1358    sucralfate (CARAFATE) 1 g tablet  4 times daily        08/17/21 1358             Note:  This document was prepared using Dragon voice recognition software and may include unintentional dictation errors.   Gilles Chiquito, MD 08/17/21 1400

## 2021-12-06 ENCOUNTER — Other Ambulatory Visit: Payer: Self-pay

## 2021-12-06 ENCOUNTER — Encounter: Payer: Self-pay | Admitting: Emergency Medicine

## 2021-12-06 ENCOUNTER — Emergency Department
Admission: EM | Admit: 2021-12-06 | Discharge: 2021-12-06 | Disposition: A | Discharge status: Home / Self Care | Payer: Medicaid Other | Attending: Emergency Medicine | Admitting: Emergency Medicine

## 2021-12-06 DIAGNOSIS — F1910 Other psychoactive substance abuse, uncomplicated: Secondary | ICD-10-CM | POA: Insufficient documentation

## 2021-12-06 DIAGNOSIS — F419 Anxiety disorder, unspecified: Secondary | ICD-10-CM | POA: Insufficient documentation

## 2021-12-06 DIAGNOSIS — Z01818 Encounter for other preprocedural examination: Secondary | ICD-10-CM | POA: Insufficient documentation

## 2021-12-06 HISTORY — DX: Unspecified convulsions: R56.9

## 2021-12-06 NOTE — ED Provider Notes (Signed)
? ?  [facility] ?Provider Note ? ? ? None  ?  (approximate) ? ? ?History  ? ?Medical Clearance ? ? ?HPI ? ?Allison Castro is a 21 y.o. female with a history of substance abuse presents in police custody.  Patient was apparently being arrested, she stated that she is concerned that she is withdrawing from substance.  She reports that she has taken narcotics and she feels nauseated now.  No abdominal pain.  No chest pain or shortness of breath. ?  ? ? ?Physical Exam  ? ?Triage Vital Signs: ?ED Triage Vitals  ?Enc Vitals Group  ?   BP 12/06/21 1318 (!) 117/93  ?   Pulse Rate 12/06/21 1318 94  ?   Resp 12/06/21 1318 20  ?   Temp 12/06/21 1318 99 ?F (37.2 ?C)  ?   Temp Source 12/06/21 1318 Oral  ?   SpO2 12/06/21 1318 98 %  ?   Weight 12/06/21 1320 40.8 kg (90 lb)  ?   Height 12/06/21 1320 1.524 m (5')  ?   Head Circumference --   ?   Peak Flow --   ?   Pain Score 12/06/21 1320 7  ?   Pain Loc --   ?   Pain Edu? --   ?   Excl. in [place]? --   ? ? ?Most recent vital signs: ?Vitals:  ? 12/06/21 1318  ?BP: (!) 117/93  ?Pulse: 94  ?Resp: 20  ?Temp: 99 ?F (37.2 ?C)  ?SpO2: 98%  ? ? ? ?General: Awake, no distress.  ?CV:  Good peripheral perfusion.  Regular rate and rhythm ?Resp:  Normal effort.  CTA bilaterally ?Abd:  No distention.  ?Other:   ? ? ?ED Results / Procedures / Treatments  ? ?Labs ?(all labs ordered are listed, but only abnormal results are displayed) ?Labs Reviewed - No data to display ? ? ?EKG ? ? ? ? ?RADIOLOGY ? ? ? ? ?PROCEDURES: ? ?Critical Care performed:  ? ?Procedures ? ? ?MEDICATIONS ORDERED IN ED: ?Medications - No data to display ? ? ?IMPRESSION / MDM / ASSESSMENT AND PLAN / ED COURSE  ?I reviewed the triage vital signs and the nursing notes. ? ? ?Patient presents for medical clearance and police custody.  She is well-appearing and in no acute distress, she is somewhat anxious.  Physical exam is reassuring.  Do not feel lab work is necessary ? ?She is appropriate for discharge at this  time into police custody. ? ? ? ? ?  ? ? ?FINAL CLINICAL IMPRESSION(S) / ED DIAGNOSES  ? ?Final diagnoses:  ?Polysubstance abuse (HCC)  ? ? ? ?Rx / DC Orders  ? ?ED Discharge Orders   ? ? None  ? ?  ? ? ? ?Note:  This document was prepared using Dragon voice recognition software and may include unintentional dictation errors. ?  ?Jene Every, MD ?12/06/21 1538 ? ?

## 2021-12-06 NOTE — ED Triage Notes (Signed)
Patient to ED with [facility] for medical clearance. Patient states she is having withdrawls from some type of unknown pills. Patient states she is having "cold chills and skin crawling." Patient is tearful in triage and keeps repeating "it's going to get worse." ?

## 2022-02-13 ENCOUNTER — Emergency Department
Admission: EM | Admit: 2022-02-13 | Discharge: 2022-02-13 | Disposition: A | Discharge status: Home / Self Care | Payer: Medicaid Other | Source: Home / Self Care

## 2022-03-19 ENCOUNTER — Emergency Department
Admission: EM | Admit: 2022-03-19 | Discharge: 2022-03-19 | Disposition: A | Discharge status: Home / Self Care | Payer: Medicaid Other | Attending: Emergency Medicine | Admitting: Emergency Medicine

## 2022-03-19 ENCOUNTER — Emergency Department: Payer: Medicaid Other

## 2022-03-19 ENCOUNTER — Encounter: Payer: Self-pay | Admitting: Intensive Care

## 2022-03-19 ENCOUNTER — Other Ambulatory Visit: Payer: Self-pay

## 2022-03-19 DIAGNOSIS — T40601A Poisoning by unspecified narcotics, accidental (unintentional), initial encounter: Secondary | ICD-10-CM | POA: Insufficient documentation

## 2022-03-19 DIAGNOSIS — F1910 Other psychoactive substance abuse, uncomplicated: Secondary | ICD-10-CM

## 2022-03-19 DIAGNOSIS — D72829 Elevated white blood cell count, unspecified: Secondary | ICD-10-CM | POA: Insufficient documentation

## 2022-03-19 DIAGNOSIS — F1410 Cocaine abuse, uncomplicated: Secondary | ICD-10-CM | POA: Insufficient documentation

## 2022-03-19 DIAGNOSIS — F1210 Cannabis abuse, uncomplicated: Secondary | ICD-10-CM | POA: Insufficient documentation

## 2022-03-19 DIAGNOSIS — R109 Unspecified abdominal pain: Secondary | ICD-10-CM | POA: Diagnosis not present

## 2022-03-19 DIAGNOSIS — E875 Hyperkalemia: Secondary | ICD-10-CM | POA: Diagnosis not present

## 2022-03-19 DIAGNOSIS — R824 Acetonuria: Secondary | ICD-10-CM | POA: Diagnosis not present

## 2022-03-19 DIAGNOSIS — T50901A Poisoning by unspecified drugs, medicaments and biological substances, accidental (unintentional), initial encounter: Secondary | ICD-10-CM

## 2022-03-19 LAB — COMPREHENSIVE METABOLIC PANEL
ALT: 9 U/L (ref 0–44)
AST: 18 U/L (ref 15–41)
Albumin: 4.2 g/dL (ref 3.5–5.0)
Alkaline Phosphatase: 69 U/L (ref 38–126)
Anion gap: 11 (ref 5–15)
BUN: 15 mg/dL (ref 6–20)
CO2: 24 mmol/L (ref 22–32)
Calcium: 9.8 mg/dL (ref 8.9–10.3)
Chloride: 105 mmol/L (ref 98–111)
Creatinine, Ser: 0.71 mg/dL (ref 0.44–1.00)
GFR, Estimated: >60 mL/min (ref >60)
Glucose, Bld: 129 mg/dL — ABNORMAL HIGH (ref 70–99)
Potassium: 5.2 mmol/L — ABNORMAL HIGH (ref 3.5–5.1)
Sodium: 140 mmol/L (ref 135–145)
Total Bilirubin: 0.5 mg/dL (ref 0.3–1.2)
Total Protein: 8.3 g/dL — ABNORMAL HIGH (ref 6.5–8.1)

## 2022-03-19 LAB — URINALYSIS, ROUTINE W REFLEX MICROSCOPIC
Bilirubin Urine: NEGATIVE
Glucose, UA: NEGATIVE mg/dL
Hgb urine dipstick: NEGATIVE
Ketones, ur: 40 mg/dL — AB
Leukocytes,Ua: NEGATIVE
Nitrite: NEGATIVE
Protein, ur: 30 mg/dL — AB
Specific Gravity, Urine: 1.02 (ref 1.005–1.030)
Squamous Epithelial / HPF: NONE SEEN (ref 0–5)
pH: 8.5 — ABNORMAL HIGH (ref 5.0–8.0)

## 2022-03-19 LAB — CBC WITH DIFFERENTIAL/PLATELET
Abs Immature Granulocytes: 0.04 10*3/uL (ref 0.00–0.07)
Basophils Absolute: 0 10*3/uL (ref 0.0–0.1)
Basophils Relative: 0 %
Eosinophils Absolute: 0 10*3/uL (ref 0.0–0.5)
Eosinophils Relative: 0 %
HCT: 38.7 % (ref 36.0–46.0)
Hemoglobin: 12.3 g/dL (ref 12.0–15.0)
Immature Granulocytes: 0 %
Lymphocytes Relative: 13 %
Lymphs Abs: 1.5 10*3/uL (ref 0.7–4.0)
MCH: 30 pg (ref 26.0–34.0)
MCHC: 31.8 g/dL (ref 30.0–36.0)
MCV: 94.4 fL (ref 80.0–100.0)
Monocytes Absolute: 0.4 10*3/uL (ref 0.1–1.0)
Monocytes Relative: 3 %
Neutro Abs: 10.1 10*3/uL — ABNORMAL HIGH (ref 1.7–7.7)
Neutrophils Relative %: 84 %
Platelets: 295 10*3/uL (ref 150–400)
RBC: 4.1 MIL/uL (ref 3.87–5.11)
RDW: 12.3 % (ref 11.5–15.5)
WBC: 12.1 10*3/uL — ABNORMAL HIGH (ref 4.0–10.5)
nRBC: 0 % (ref 0.0–0.2)

## 2022-03-19 LAB — URINE DRUG SCREEN, QUALITATIVE (ARMC ONLY)
Amphetamines, Ur Screen: NOT DETECTED
Barbiturates, Ur Screen: NOT DETECTED
Benzodiazepine, Ur Scrn: NOT DETECTED
Cannabinoid 50 Ng, Ur ~~LOC~~: POSITIVE — AB
Cocaine Metabolite,Ur ~~LOC~~: POSITIVE — AB
MDMA (Ecstasy)Ur Screen: NOT DETECTED
Methadone Scn, Ur: NOT DETECTED
Opiate, Ur Screen: NOT DETECTED
Phencyclidine (PCP) Ur S: NOT DETECTED
Tricyclic, Ur Screen: NOT DETECTED

## 2022-03-19 LAB — POC URINE PREG, ED: Preg Test, Ur: NEGATIVE

## 2022-03-19 MED ORDER — NALOXONE HCL 2 MG/2ML IJ SOSY
PREFILLED_SYRINGE | INTRAMUSCULAR | Status: AC
  Administered 2022-03-19: 1 mg
  Filled 2022-03-19: qty 2

## 2022-03-19 MED ORDER — LACTATED RINGERS IV BOLUS
1000.0000 mL | Freq: Once | INTRAVENOUS | Status: AC
  Administered 2022-03-19: 1000 mL via INTRAVENOUS

## 2022-03-19 MED ORDER — IOHEXOL 300 MG/ML  SOLN
80.0000 mL | Freq: Once | INTRAMUSCULAR | Status: AC | PRN
  Administered 2022-03-19: 80 mL via INTRAVENOUS

## 2022-03-19 NOTE — ED Triage Notes (Signed)
Patient arrived in handcuffs with [facility] [place] [employer] for being wanted. During traffic stop, officer reports she bent down and consumed a percocet that was laced with fentanyl.   Patient ambulated into ER. While waiting to be triaged she slumped over and not responding. Would wake up slightly to sternal rub, lips turned blue. Pulse ox read 65%.   Patient taken back to ER room and IV started and given narcan
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
## 2022-03-19 NOTE — ED Notes (Signed)
Pt ambulated in room to use bathroom with steady gait

## 2022-03-22 ENCOUNTER — Encounter: Payer: Self-pay | Admitting: Emergency Medicine

## 2022-03-22 ENCOUNTER — Other Ambulatory Visit: Payer: Self-pay

## 2022-03-22 DIAGNOSIS — S0181XA Laceration without foreign body of other part of head, initial encounter: Secondary | ICD-10-CM | POA: Diagnosis not present

## 2022-03-22 DIAGNOSIS — Z23 Encounter for immunization: Secondary | ICD-10-CM | POA: Insufficient documentation

## 2022-03-22 DIAGNOSIS — S01111A Laceration without foreign body of right eyelid and periocular area, initial encounter: Secondary | ICD-10-CM | POA: Diagnosis not present

## 2022-03-22 DIAGNOSIS — S0990XA Unspecified injury of head, initial encounter: Secondary | ICD-10-CM | POA: Diagnosis not present
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
## 2022-03-23 ENCOUNTER — Emergency Department
Admission: EM | Admit: 2022-03-23 | Discharge: 2022-03-23 | Attending: Emergency Medicine | Admitting: Emergency Medicine

## 2022-03-23 ENCOUNTER — Emergency Department

## 2022-03-23 ENCOUNTER — Ambulatory Visit: Payer: Medicaid Other

## 2022-03-23 DIAGNOSIS — S0990XA Unspecified injury of head, initial encounter: Secondary | ICD-10-CM | POA: Diagnosis not present

## 2022-03-23 DIAGNOSIS — S0181XA Laceration without foreign body of other part of head, initial encounter: Secondary | ICD-10-CM

## 2022-03-23 MED ORDER — TETANUS-DIPHTH-ACELL PERTUSSIS 5-2.5-18.5 LF-MCG/0.5 IM SUSY
0.5000 mL | PREFILLED_SYRINGE | Freq: Once | INTRAMUSCULAR | Status: AC
  Administered 2022-03-23: 0.5 mL via INTRAMUSCULAR
  Filled 2022-03-23: qty 0.5

## 2022-03-23 MED ORDER — LIDOCAINE-EPINEPHRINE 2 %-1:100000 IJ SOLN
20.0000 mL | Freq: Once | INTRAMUSCULAR | Status: AC
  Administered 2022-03-23: 20 mL
  Filled 2022-03-23: qty 1

## 2022-03-23 NOTE — Discharge Instructions (Addendum)
Your CT imaging was negative.  You can take Tylenol 1 g every 8 hours as needed to help with pain for 5 days.  Your sutures need to be removed in 5 days.  Return for fevers redness confusion or any other concerns

## 2022-03-23 NOTE — ED Notes (Signed)
Patient transported to CT 
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
## 2022-03-24 ENCOUNTER — Telehealth: Payer: Self-pay | Admitting: Licensed Clinical Social Worker

## 2022-03-24 NOTE — Patient Outreach (Addendum)
  Care Coordination [facility] Note Transition Care Management Unsuccessful Follow-up Telephone Call  Date of discharge and from where:  03/23/22 from [facility]  Attempts:  1st Attempt  Reason for unsuccessful TCM follow-up call:  Left voice message  Dickie La, BSW, MSW, [employer]aged [employer] LCSW [facility]  [facility] [email or web address] Phone: [phone]

## 2022-03-26 IMAGING — US US ABDOMEN LIMITED
1 series · 14 of 25 positions shown · non-contrast
Comparison: None.

CLINICAL DATA: Right upper quadrant pain

EXAM:
ULTRASOUND ABDOMEN LIMITED RIGHT UPPER QUADRANT

[Series 1: us abdomen limited ruq (liver/gb) · 14 of 43 slices shown]
[im 1/43]
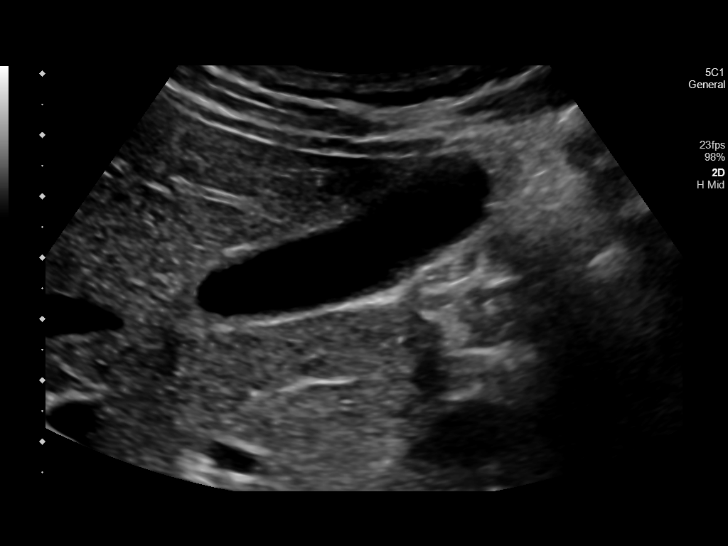
[im 4/43]
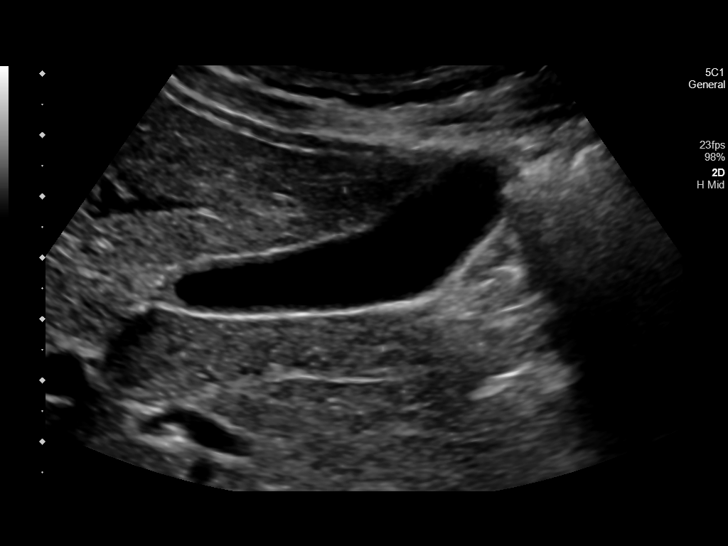
[im 8/43]
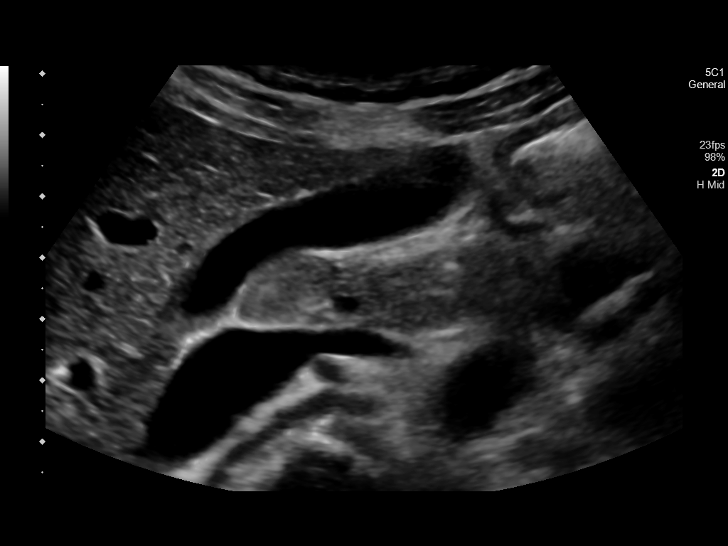
[im 11/43]
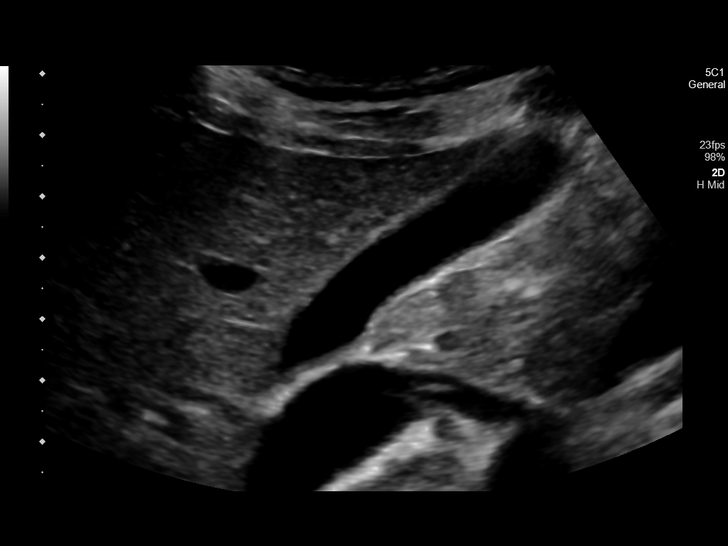
[im 15/43]
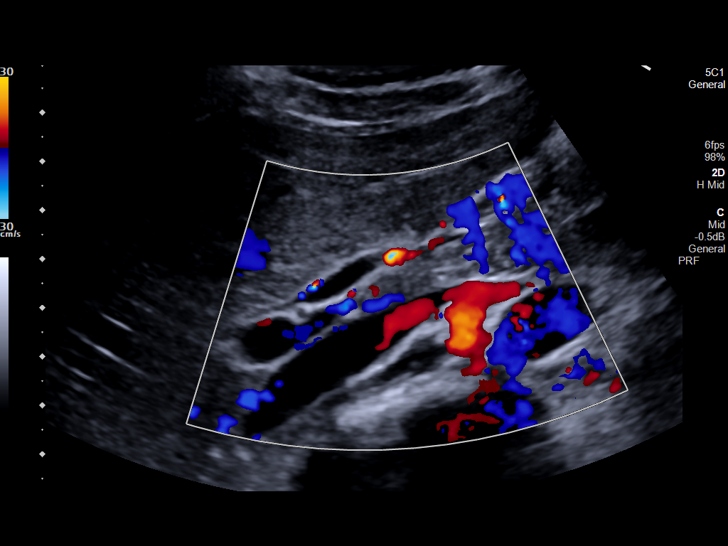
[im 16/43]
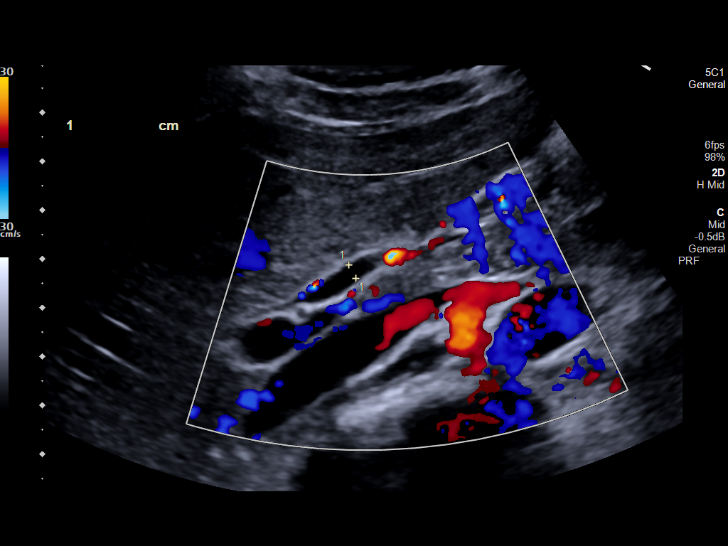
[im 20/43]
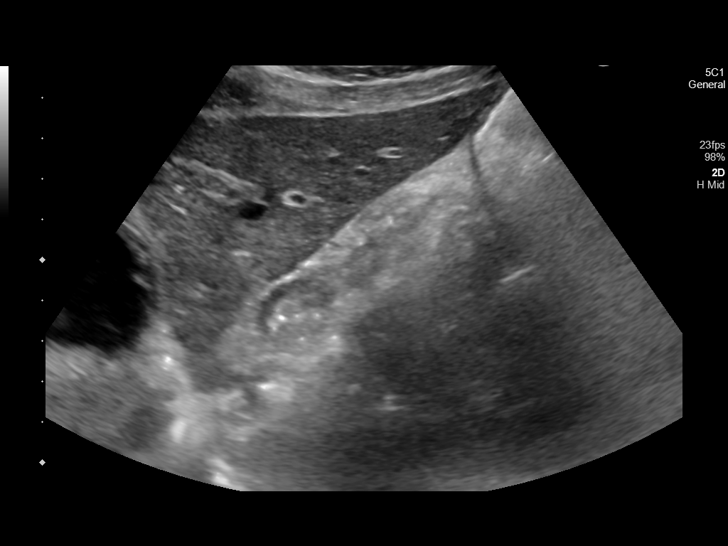
[im 23/43]
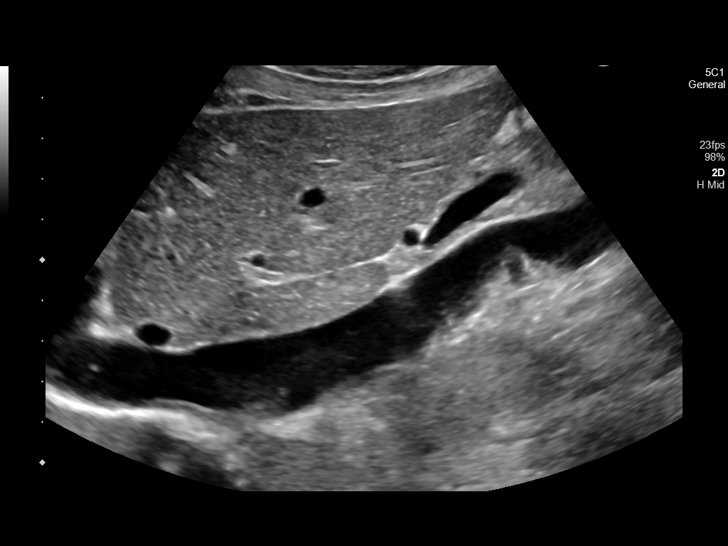
[im 27/43]
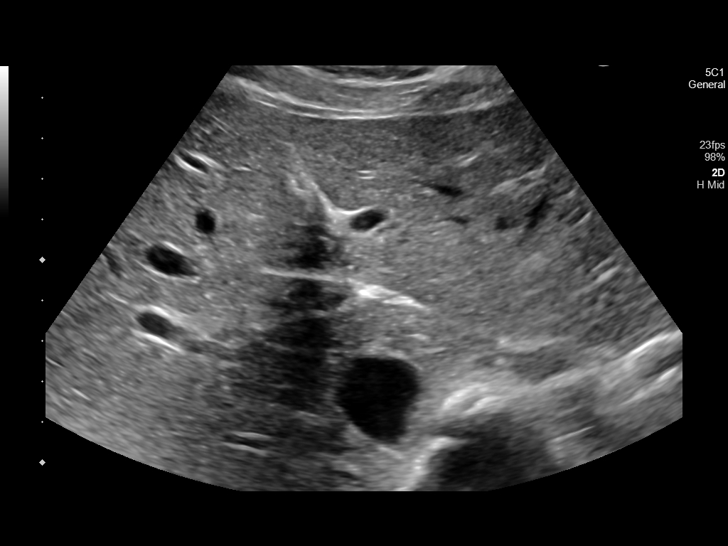
[im 29/43]
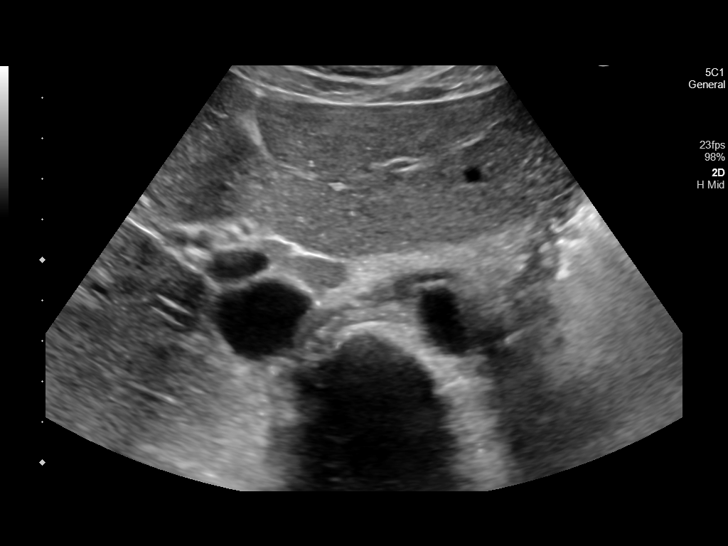
[im 32/43]
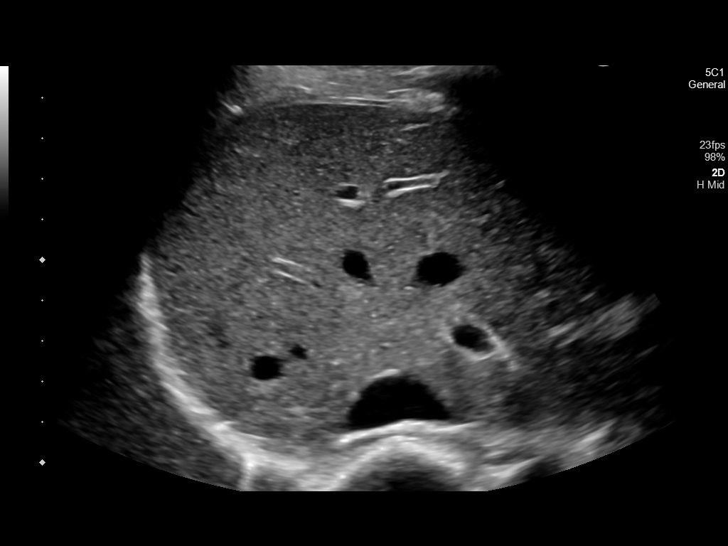
[im 36/43]
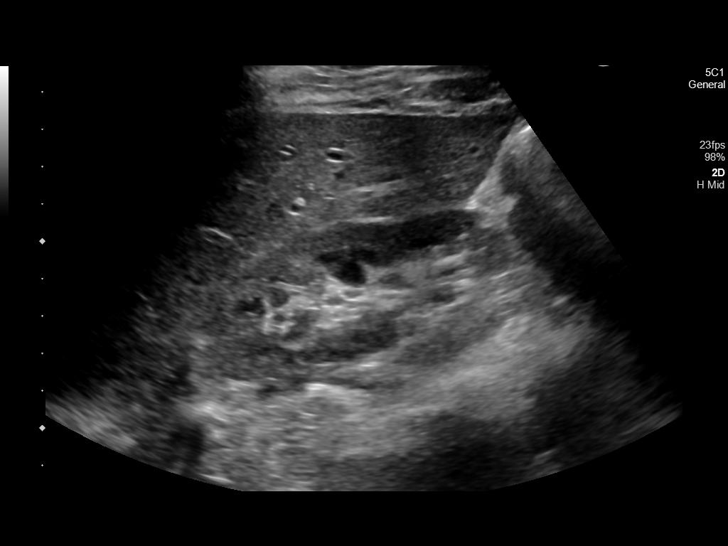
[im 39/43]
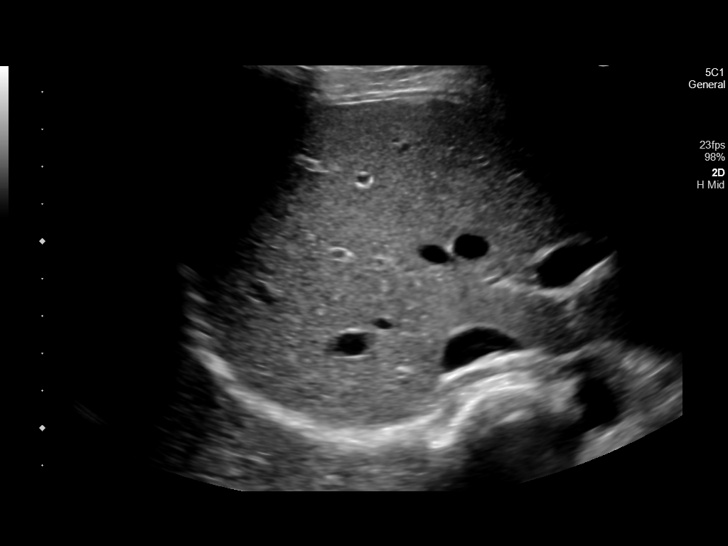
[im 43/43]
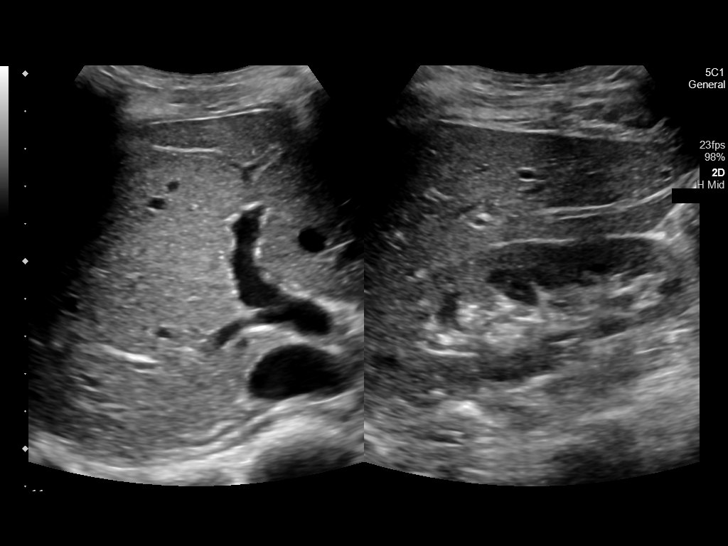

[14 of 25 positions shown; findings below may reference images not displayed]

FINDINGS: Gallbladder:

No gallstones or wall thickening visualized. No sonographic Murphy
sign noted by sonographer.

Common bile duct:

Diameter: 3 mm

Liver:

No focal lesion identified. Within normal limits in parenchymal
echogenicity. Portal vein is patent on color Doppler imaging with
normal direction of blood flow towards the liver.

Other: None.
IMPRESSION: Unremarkable right upper quadrant ultrasound.

## 2022-04-14 ENCOUNTER — Telehealth: Payer: Self-pay | Admitting: Obstetrics and Gynecology
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
## 2022-11-09 ENCOUNTER — Other Ambulatory Visit: Payer: Self-pay

## 2022-11-09 ENCOUNTER — Emergency Department
Admission: EM | Admit: 2022-11-09 | Discharge: 2022-11-09 | Discharge status: Against Medical Advice | Payer: BLUE CROSS/BLUE SHIELD | Attending: Emergency Medicine | Admitting: Emergency Medicine

## 2022-11-09 DIAGNOSIS — R197 Diarrhea, unspecified: Secondary | ICD-10-CM | POA: Insufficient documentation

## 2022-11-09 DIAGNOSIS — R1084 Generalized abdominal pain: Secondary | ICD-10-CM | POA: Insufficient documentation

## 2022-11-09 DIAGNOSIS — R112 Nausea with vomiting, unspecified: Secondary | ICD-10-CM | POA: Insufficient documentation

## 2022-11-09 DIAGNOSIS — Z5321 Procedure and treatment not carried out due to patient leaving prior to being seen by health care provider: Secondary | ICD-10-CM | POA: Diagnosis not present

## 2022-11-09 LAB — LIPASE, BLOOD: Lipase: 43 U/L (ref 11–51)

## 2022-11-09 LAB — COMPREHENSIVE METABOLIC PANEL
ALT: 8 U/L (ref 0–44)
AST: 14 U/L — ABNORMAL LOW (ref 15–41)
Albumin: 3.6 g/dL (ref 3.5–5.0)
Alkaline Phosphatase: 68 U/L (ref 38–126)
Anion gap: 14 (ref 5–15)
BUN: 6 mg/dL (ref 6–20)
CO2: 22 mmol/L (ref 22–32)
Calcium: 8.4 mg/dL — ABNORMAL LOW (ref 8.9–10.3)
Chloride: 97 mmol/L — ABNORMAL LOW (ref 98–111)
Creatinine, Ser: 0.6 mg/dL (ref 0.44–1.00)
GFR, Estimated: >60 mL/min (ref >60)
Glucose, Bld: 118 mg/dL — ABNORMAL HIGH (ref 70–99)
Potassium: 3.1 mmol/L — ABNORMAL LOW (ref 3.5–5.1)
Sodium: 133 mmol/L — ABNORMAL LOW (ref 135–145)
Total Bilirubin: 0.9 mg/dL (ref 0.3–1.2)
Total Protein: 7.4 g/dL (ref 6.5–8.1)

## 2022-11-09 LAB — CBC
HCT: 38.1 % (ref 36.0–46.0)
Hemoglobin: 12.3 g/dL (ref 12.0–15.0)
MCH: 30.5 pg (ref 26.0–34.0)
MCHC: 32.3 g/dL (ref 30.0–36.0)
MCV: 94.5 fL (ref 80.0–100.0)
Platelets: 224 10*3/uL (ref 150–400)
RBC: 4.03 MIL/uL (ref 3.87–5.11)
RDW: 12.6 % (ref 11.5–15.5)
WBC: 17.1 10*3/uL — ABNORMAL HIGH (ref 4.0–10.5)
nRBC: 0 % (ref 0.0–0.2)

## 2022-11-09 NOTE — ED Provider Notes (Signed)
 [facility] Emergency Department Provider Note    ED Clinical Impression    Final diagnoses:  TOA (tubo-ovarian abscess) (Primary)  Left against medical advice        Impression, Medical Decision Making, Progress Notes and Critical Care    Impression, Differential Diagnosis and Plan of Care  Patient was evaluated in room MT1  Patient presents for evaluation of right lower quadrant abdominal pain.  Differential diagnosis at this time includes but is not limited to: Acute appendicitis, ectopic pregnancy, ovarian torsion, ruptured ovarian cyst, ureterolithiasis, tubo-ovarian abscess, among other potential abnormalities.  Recommended evaluation with CBC, CMP, hCG quant, urinalysis, and testing for gonorrhea, chlamydia, BV/trichomoniasis.  Due to hospital location, we are unable to perform a pelvic examination the patient did perform self swabs.  Laboratory results are concerning for findings of leukocytosis, positive BV and trichomoniasis.  CT imaging concerning for bilateral tubo-ovarian abscess, ultrasound confirming same.  I did discuss these results with the patient who verbalized understanding.  Discussed severity of the diagnosis, need for admission for continued IV antibiotics and potential surgical management.  The patient verbalized understanding.  She noted that she was not able at this time to be directly admitted to the hospital, she wanted to leave, so that she could go home to do a few personal tasks, get some belongings, and she did say that she would return to be admitted.  I did advise the patient that at this time I cannot endorse her leaving the hospital for any reason, that I do strongly recommend that she be admitted directly to the hospital.  Advised the patient that by leaving the hospital there is risk of potential death, permanent disability, or sepsis.  Discussed with the patient that I would still be speaking to OB/GYN on her behalf.  The patient  verbalized understanding and still insisted that she would be leaving AGAINST MEDICAL ADVICE.  I did speak to Dr. Laneta Bras at approximately 1800, she is the OB/GYN on-call.  She agrees and strongly recommends that the patient be admitted.  She notes that if the patient were to be admitted she would need to be transferred to [facility].  I did discuss these specialist recommendations with the patient.  Again strongly recommended admission to the hospital, was transferred to [facility].  Patient verbalized understanding, understands risks of leaving AGAINST MEDICAL ADVICE as noted above, stated that after she did her personal tasks, that she would go to the emergency department in [place].  At this time, I did recommend at least administering IV antibiotics.  Patient did receive IV Rocephin , however she had not received her Flagyl  when she stated that she needed to leave the emergency department.  At this time, the patient does display ability to make her own medical decisions.  Verbalizes understanding of risks of leaving the hospital AGAINST MEDICAL ADVICE.  Understands that she is able to return to the emergency department at any time.   Independent Interpretation of Studies  I have independently interpreted the following studies: CT/MRI(s): Bilateral tubo-ovarian abscess Ultrasound(s): Tubo-ovarian abscess.  No torsion.  Considerations Regarding Disposition/Escalation of Care and Critical Care  Indications for observation/admission (or consideration of observation/admission) and/or appropriateness for outpatient management: Strong recommendation for inpatient treatment for bilateral tubo-ovarian abscess Patient/Family/Caregiver Discussions: As above.  Portions of this record have been created using Scientist, clinical (histocompatibility and immunogenetics). Dictation errors have been sought, but may not have been identified and corrected.  See chart and resident provider documentation for  details.  ____________________________________________     HISTORY     Reason for Visit Abdominal Pain   HPI  Allison Castro is a 22 y.o. female with no significant chronic medical conditions who presents for evaluation 1 week history of gradually increasing right lower quadrant abdominal pain.  Pain is constant, moderate, radiates to the entire abdomen, worsens with certain movements, improved with laying a certain way.  Patient reports nausea, vomiting, dysuria, and diarrhea.  Patient denies fevers, chills, hematuria, abnormal vaginal bleeding or discharge.  She has been taking ibuprofen , Tylenol , and a friend's Percocet for symptoms without significant improvement.  She states that she has never had prior abdominal surgery.  However, she is concerned because in the past she has had a similar pain, and she had an infected cyst, from when her boyfriend gave her gonorrhea, chlamydia, and trichomoniasis.    No past medical history on file.  There is no problem list on file for this patient.   No past surgical history on file.  No current facility-administered medications for this encounter. No current outpatient medications on file.  Allergies Patient has no known allergies.  No family history on file.  Social History     PHYSICAL EXAM    ED Triage Vitals [11/09/22 1332]  Enc Vitals Group     BP 109/79     Heart Rate 97     SpO2 Pulse      Resp 18     Temp 36.3 C (97.4 F)     Temp Source Oral     SpO2 100 %     Weight 56.7 kg (125 lb)     Height 1.524 m (5')     Head Circumference      Peak Flow      Pain Score      Pain Loc      Pain Edu?      Excl. in GC?     General: Alert, no acute distress. Skin: Warm, dry. Head: Normocephalic, atraumatic. Neck: Trachea midline. No nuchal rigidity or meningismus. Eye: Pupils are equal, round and reactive to light, normal conjunctiva. Ears, nose, mouth and throat: oral mucosa moist, no pharyngeal erythema or  exudate. Cardiovascular: Regular rate and rhythm, Normal peripheral perfusion, No edema. Respiratory: Lungs are clear to auscultation, respirations are non-labored, breath sounds are equal. Chest wall: No deformity. Back: Normal range of motion. Musculoskeletal: Normal ROM, normal strength. Gastrointestinal: Soft, Non distended. Bsx4. Moderate RLQ tenderness. Neurological: Alert and oriented to person, place, time, and situation, No focal neurological deficit observed. Psychiatric: Cooperative, appropriate mood & affect.     RESULTS    Labs   Results for orders placed or performed during the hospital encounter of 11/09/22  Vaginitis   Specimen: Vagina; Swab  Result Value Ref Range   Bacterial Vaginitis Positive (A) Negative   Yeast Screen No Budding Yeast No Budding Yeast  Rapid Trichomonas   Specimen: Vagina; Swab  Result Value Ref Range   Trichomonas Scrn Positive (A) Negative  Comprehensive Metabolic Panel  Result Value Ref Range   Sodium 136 135 - 145 mmol/L   Potassium 3.5 3.4 - 4.8 mmol/L   Chloride 102 98 - 107 mmol/L   CO2 23.6 20.0 - 31.0 mmol/L   Anion Gap 10 5 - 14 mmol/L   BUN <5 (L) 9 - 23 mg/dL   Creatinine 9.46 (L) 9.44 - 1.02 mg/dL   eGFR CKD-EPI (7978) Female >90 >=60 mL/min/1.72m2   Glucose 101 70 -  179 mg/dL   Calcium 9.2 8.7 - 89.5 mg/dL   Albumin 3.6 3.4 - 5.0 g/dL   Total Protein 7.6 5.7 - 8.2 g/dL   Total Bilirubin 0.4 0.3 - 1.2 mg/dL   AST 12 <=65 U/L   ALT <7 (L) 10 - 49 U/L   Alkaline Phosphatase 79 46 - 116 U/L  Urinalysis with Microscopy with Culture Reflex   Specimen: Clean Catch; Urine  Result Value Ref Range   Color, UA Yellow    Clarity, UA Turbid    Specific Gravity, UA 1.027 1.003 - 1.030   pH, UA 6.0 5.0 - 9.0   Leukocyte Esterase, UA Moderate (A) Negative   Nitrite, UA Negative Negative   Protein, UA 50 mg/dL (A) Negative   Glucose, UA Negative Negative   Ketones, UA >150 mg/dL (A) Negative   Urobilinogen, UA 3.0 mg/dL (A)  <7.9 mg/dL   Bilirubin, UA Small (A) Negative   Blood, UA Small (A) Negative   RBC, UA 7 (H) <=4 /HPF   WBC, UA 26 (H) 0 - 5 /HPF   Squam Epithel, UA 15 (H) 0 - 5 /HPF   Bacteria, UA None Seen None Seen /HPF   Mucus, UA Many (A) None Seen /HPF  hCG QUANTitative, Blood  Result Value Ref Range   hCG Quantitative <2.6 mIU/mL  Lipase  Result Value Ref Range   Lipase 43 12 - 53 U/L  CBC w/ Differential  Result Value Ref Range   WBC 17.4 (H) 3.6 - 11.2 10*9/L   RBC 3.89 (L) 3.95 - 5.13 10*12/L   HGB 12.2 11.3 - 14.9 g/dL   HCT 63.7 65.9 - 55.9 %   MCV 93.1 77.6 - 95.7 fL   MCH 31.3 25.9 - 32.4 pg   MCHC 33.6 32.0 - 36.0 g/dL   RDW 86.4 87.7 - 84.7 %   MPV 9.6 6.8 - 10.7 fL   Platelet 203 150 - 450 10*9/L   nRBC 0 <=4 /100 WBCs   Neutrophils % 89.9 %   Lymphocytes % 7.2 %   Monocytes % 2.7 %   Eosinophils % 0.0 %   Basophils % 0.2 %   Absolute Neutrophils 15.7 (H) 1.8 - 7.8 10*9/L   Absolute Lymphocytes 1.2 1.1 - 3.6 10*9/L   Absolute Monocytes 0.5 0.3 - 0.8 10*9/L   Absolute Eosinophils 0.0 0.0 - 0.5 10*9/L   Absolute Basophils 0.0 0.0 - 0.1 10*9/L     Radiology   CT Abdomen Pelvis W IV Contrast  Result Date: 11/09/2022 EXAM: CT ABDOMEN PELVIS W CONTRAST ACCESSION: [id number] UN CLINICAL INDICATION: 22 years old with RLQ pain  COMPARISON: Endovaginal ultrasound 11/09/2022 TECHNIQUE: A helical CT scan of the abdomen and pelvis was obtained following IV contrast from the lung bases through the pubic symphysis. Images were reconstructed in the axial plane. Coronal and sagittal reformatted images were also provided for further evaluation. FINDINGS: LOWER CHEST: The visualized lung bases are clear. No pleural effusions are seen. LIVER: Normal liver contour.  No focal liver lesions. BILIARY: The gallbladder appears unremarkable. No biliary ductal dilatation.  SPLEEN: Normal in size and contour. Tiny splenule. PANCREAS: Normal pancreatic contour.  No focal lesions.  No ductal dilation.  ADRENAL GLANDS: Normal appearance of the adrenal glands. KIDNEYS/URETERS: Symmetric renal enhancement. No hydronephrosis. No solid renal mass. BLADDER: Incompletely distended with circumferential bladder wall thickening. REPRODUCTIVE ORGANS: The uterus is present. There is a multiloculated complex cystic lesion in the right adnexa which is difficult to accurately measure  but measures approximately 5.6 x 3.3 x 4.3 cm (series 2#100 & series 4#34). The largest loculation of fluid measures approximately 3.7 cm (series 2#100). Along the posterior aspect of this is a thickened tubular structure (series 4#43). Multiple low-attenuation lesions are seen in the left ovary as well. GI TRACT: The stomach appears unremarkable. The loops of small bowel are normal in caliber. No definite evidence of acute colonic pathology is identified. The appendix is nondilated. Normal caliber, gas-containing appendix. PERITONEUM, RETROPERITONEUM AND MESENTERY: No free air. Small volume pelvic free fluid with thickening with associated peritoneal thickening and enhancement anterior to the rectum (2:111). LYMPH NODES: Prominent subcentimeter retroperitoneal lymph nodes. For example aortocaval node measuring 0.8 cm (2:45). Right pelvic sidewall node measuring 1.0 cm (2:94), likely reactive. VESSELS: Hepatic and portal veins are patent.  Normal caliber aorta.  BONES and SOFT TISSUES: No aggressive osseous lesions.  No focal soft tissue lesions.   Complex right adnexal cystic lesion, highly concerning for tubo-ovarian abscess. There is small volume pelvic free fluid and thickening and enhancement of the peritoneum, highly concerning for peritonitis. Multiple small cystic spaces are seen in the left ovary which could present follicles; however, additional infection cannot be excluded. Mild circumferential bladder wall thickening, likely reactive, though cannot exclude superimposed cystitis. Clinical correlation is recommended. The findings of this  study were discussed via telephone with DR. KATRINA CARBONELL AQUINO by Dr. Lonni Salinas on 11/09/2022 5:02 PM. -----------------------------------------------   US  Endovaginal (Non-OB)  Result Date: 11/09/2022 EXAM: US  ENDOVAGINAL (NON-OB) ACCESSION: [id number] UN CLINICAL INDICATION: 22 years old with RLQ pain  LMP:  11/03/2022  COMPARISON: Same day CT abdomen pelvis TECHNIQUE: Ultrasound views of the pelvis were obtained endovaginally using gray scale and color Doppler imaging. Spectral Doppler imaging was also performed. FINDINGS: UTERUS/CERVIX: The uterus was anteverted and measured 7.8 x 2.8 x 3.5 cm. No focal myometrial mass was seen. The endometrium normal in thickness, measuring  0.3   cm. OVARIES: The ovaries were seen well transvaginally. . There is a right adnexal complex cystic lesion measuring up to 4.2 x 3.1 x 3.2 cm. Appropriate arterial inflow and venous outflow of the ovaries was documented on color and spectral Doppler imaging. The right ovary measured 6.4 x 4.3 x 4.9 cm and the left ovary measured 4.9 x 4.4 x 4.3 cm. There is a small echogenic focus within the left adnexa measuring approximately 1.7 x 1.6 x 1.7 cm. Multiple small simple appearing cystic lesions are noted in the left ovary, presumably follicles. OTHER: Moderate abnormal pelvic free fluid.   --Complex cystic lesion in the right adnexa right ovary which could represent a hemorrhagic cyst; however, Infectious etiology/tubo-ovarian abscess cannot be excluded, particularly given the elevated white blood cell count. Clinical correlation is advised. --Moderate amount of free fluid in the pelvis. Please see below for data measurements: LMP:  11/03/2022  Uterus: Sagittal 7.8 cm; AP 2.8 cm; Transverse 3.5 cm Endometrium:  0.3   cm Right ovary: Sagittal 6.4 cm; AP 4.3 cm; Transverse 4.9 cm Left ovary: Sagittal 4.9 cm; AP 4.4 cm; Transverse 4.3 cm   Medications administered this visit   @MEDADMIN @ Procedures    Procedures        Georjean Falling [place], [place] 11/09/22 2137

## 2022-11-09 NOTE — ED Triage Notes (Signed)
Pt to ED for generalized abd pain for one week. +n/v/d.

## 2022-11-11 NOTE — Discharge Summary (Signed)
 ------------------------------------------------------------------------------- Attestation with edits by Castro Norris, MD at 11/11/22 2357 I saw and evaluated the patient, participating in the key portions.  Patient left AMA.  I reviewed the resident's note and agree with the discharge plans and disposition.   I personally spent 20 minutes speaking with patient.  Please see my significant note.   Norris Allison Taft, MD Department of OB/Gyn 11/11/22   -------------------------------------------------------------------------------  Gynecology Physician Discharge Summary  Admit Date: 11/10/2022  Discharge Date and Time: 11/11/2022 3:11 PM   Discharge to: Home  Discharge Service: Gynecology (GYN)  Discharge Attending Physician: Taft, MD  Discharge Diagnoses:  Principal Problem:   TOA (tubo-ovarian abscess)   Procedures: none  Hospital Course:  Allison Castro is a 22 y.o. F who presented to the ED with RLQ abdominal pain x1 week.  She initially presented to [facility] ED on 4/11 and was found to have R TOA, admission was recommended, but patient left AMA. Lab work collected at that time was s/f leukocytosis to 17.4, positive for trichomonas, BV and  chlamydia. Blood cultures were drawn which subsequently showed growth of Haemophilus influenzae on 1/2 draws. Urine culture with <100K proteus mirabilis.  She represented to our ED on 4/12 with subjective fever/chills and progressive RLQ pain. Repeat urine and blood cultures were drawn, which were pending at the time of her discharge. Was admitted for IV antibiotics and started on Ceftriaxone , Doxycyline, Flagyl . Her urine tox screen was positive for cannabinoids and fentanyl  which upon records review morphine given on 11/09/22. Patient denied recreational use, but notably has a documented history of OUD with ED visit within the past year (02/2022) for opioid overdose. She required multiple doses of oxycodone  and morphine while inpatient.  She remained afebrile with stable vitals.   On HD#2 she reported her pain was 0/10 and was requesting to be discharged. Ahlschlager MD and attending Dr. Taft counseled patient that risks of discharging include sepsis and even death, that continued IV antibiotic administration was strongly recommended. Patient insisted on leaving and signed paperwork documenting her discharge against medical advice.  See significant event note.  She was prescribed doxycyline and flagyl  to complete a 14-day course of antibiotics. Was instructed that clinic follow up was recommended within the week to repeat exam, follow up on labs, which she stated she was amenable. Will send message to outpatient clinic to schedule. Return precautions were reviewed and patient expressed understanding.  Condition at Discharge: stable  Discharge Day Services: The patient was seen by the primary team on day of discharge. BP 98/51   Pulse 63   Temp 36.6 C (97.9 F) (Oral)   Resp 16   Ht 152.4 cm (5')   Wt 51.4 kg (113 lb 5.1 oz)   LMP 11/02/2022 (Exact Date)   SpO2 100%   BMI 22.13 kg/m   General:                      Awake, alert, in no distress CV:                              Normal rate Lungs:                         Normal work of breathing. Abdomen:                    Soft, nondistended, moderate tenderness to palpation of  the RLQ with voluntary guarding. No rebound or rigidity.  Extremities:                 Warm, well perfused.  No calf tenderness bilaterally. Psych:                         Appropriate mood and affect. GU:                              Pt declining pelvic exam at this time.    Discharge Medications:    Your Medication List     START taking these medications    acetaminophen  325 MG tablet Commonly known as: TYLENOL  Take 2 tablets (650 mg total) by mouth every six (6) hours as needed for pain.   doxycycline  50 MG capsule Commonly known as: VIBRAMYCIN  Take 2 capsules (100 mg total) by  mouth two (2) times a day for 13 days.   ibuprofen  200 MG tablet Commonly known as: ADVIL ,MOTRIN  Take 3 tablets (600 mg total) by mouth every six (6) hours as needed for mild pain.   metroNIDAZOLE  500 MG tablet Commonly known as: Flagyl  Take 1 tablet (500 mg total) by mouth two (2) times a day for 13 days.        Recent Test Results:  Lab Results  Component Value Date   WBC 14.5 (H) 11/10/2022   HGB 12.4 11/10/2022   HCT 37.3 11/10/2022   PLT 216 11/10/2022    Lab Results  Component Value Date   NA 134 (L) 11/10/2022   K 3.3 (L) 11/10/2022   CL 98 11/10/2022   CO2 24.0 11/10/2022   BUN <5 (L) 11/10/2022   CREATININE 0.43 (L) 11/10/2022   GLU 77 11/10/2022   CALCIUM 9.1 11/10/2022    Lab Results  Component Value Date   BILITOT 0.4 11/10/2022   BILIDIR 0.20 11/09/2022   PROT 8.0 11/10/2022   ALBUMIN 3.5 11/10/2022   ALT <7 (L) 11/10/2022   AST 10 11/10/2022   ALKPHOS 93 11/10/2022    Pending Test Results:  Pending Labs     Order Current Status   Urine Culture In process   Blood Culture Preliminary result   Blood Culture Preliminary result       Discharge Instructions:  Activity Instructions     Activity as tolerated        Diet Instructions     Discharge diet (specify)     Discharge Nutrition Therapy: Regular      Follow Up instructions and Outpatient Referrals    Call MD for:  difficulty breathing, headache or visual disturbances     Call MD for:  persistent dizziness or light-headedness     Call MD for:  persistent nausea or vomiting     Call MD for:  redness, tenderness, or signs of infection (pain, swelling,  redness, odor or green/yellow discharge around incision site)     Call MD for:  severe uncontrolled pain     Call MD for:  temperature >38.5 Celsius (101.3 Fahrenheit)     Discharge instructions        The patient's hospitalization has been complicated by the following clinically significant conditions requiring additional  evaluation and treatment or having a significant effect of this patient's care: - None  Body mass index is 22.13 kg/m.       Code Status: Full Code

## 2022-11-13 NOTE — ED Provider Notes (Signed)
LWBS/AMA Patient Follow-Up Call  Chart reviewed after patient AMA. Patient has since been evaluated by [facility] on another visit, per chart documentation. No further actions needed.

## 2022-12-07 ENCOUNTER — Ambulatory Visit: Payer: BLUE CROSS/BLUE SHIELD | Admitting: Physician Assistant

## 2022-12-07 DIAGNOSIS — F1990 Other psychoactive substance use, unspecified, uncomplicated: Secondary | ICD-10-CM | POA: Insufficient documentation

## 2023-02-20 ENCOUNTER — Emergency Department
Admission: EM | Admit: 2023-02-20 | Discharge: 2023-02-20 | Disposition: A | Discharge status: Home / Self Care | Payer: BLUE CROSS/BLUE SHIELD | Attending: Emergency Medicine | Admitting: Emergency Medicine

## 2023-02-20 ENCOUNTER — Emergency Department: Payer: BLUE CROSS/BLUE SHIELD

## 2023-02-20 ENCOUNTER — Other Ambulatory Visit: Payer: Self-pay

## 2023-02-20 DIAGNOSIS — S0083XA Contusion of other part of head, initial encounter: Secondary | ICD-10-CM

## 2023-02-20 DIAGNOSIS — S022XXA Fracture of nasal bones, initial encounter for closed fracture: Secondary | ICD-10-CM | POA: Diagnosis not present

## 2023-02-20 DIAGNOSIS — Y92410 Unspecified street and highway as the place of occurrence of the external cause: Secondary | ICD-10-CM | POA: Insufficient documentation

## 2023-02-20 DIAGNOSIS — S01511A Laceration without foreign body of lip, initial encounter: Secondary | ICD-10-CM | POA: Diagnosis not present

## 2023-02-20 DIAGNOSIS — M542 Cervicalgia: Secondary | ICD-10-CM | POA: Insufficient documentation

## 2023-02-20 DIAGNOSIS — M545 Low back pain, unspecified: Secondary | ICD-10-CM | POA: Insufficient documentation

## 2023-02-20 DIAGNOSIS — S0992XA Unspecified injury of nose, initial encounter: Secondary | ICD-10-CM | POA: Diagnosis present

## 2023-02-20 MED ORDER — OXYCODONE-ACETAMINOPHEN 5-325 MG PO TABS
1.0000 | ORAL_TABLET | Freq: Once | ORAL | Status: AC
  Administered 2023-02-20: 1 via ORAL
  Filled 2023-02-20: qty 1

## 2023-02-20 MED ORDER — CEPHALEXIN 500 MG PO CAPS: 500.0000 mg | ORAL_CAPSULE | Freq: Four times a day (QID) | ORAL | 0 refills | Status: AC

## 2023-02-20 MED ORDER — LIDOCAINE-EPINEPHRINE-TETRACAINE (LET) TOPICAL GEL
3.0000 mL | Freq: Once | TOPICAL | Status: AC
  Administered 2023-02-20: 3 mL via TOPICAL
  Filled 2023-02-20: qty 3

## 2023-02-20 MED ORDER — OXYCODONE-ACETAMINOPHEN 5-325 MG PO TABS: 1.0000 | ORAL_TABLET | ORAL | 0 refills | Status: AC | PRN

## 2023-02-20 NOTE — Discharge Instructions (Signed)
Follow-up with [facility] follow-up with [facility] for recheck of the fracture.  Take the antibiotic as prescribed to prevent infection and the broken bone.  Do not blow your nose.  Use the saline and a small syringe to irrigate the nose.  Take the medication as prescribed.  Also take over-the-counter Tylenol and ibuprofen for pain.  Oxycodone is for pain not controlled by these medications.
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
## 2024-01-31 ENCOUNTER — Emergency Department (HOSPITAL_COMMUNITY): Admission: EM | Admit: 2024-01-31 | Discharge: 2024-01-31 | Discharge status: Against Medical Advice | Payer: MEDICAID

## 2024-01-31 DIAGNOSIS — T50901A Poisoning by unspecified drugs, medicaments and biological substances, accidental (unintentional), initial encounter: Secondary | ICD-10-CM

## 2024-01-31 DIAGNOSIS — R4182 Altered mental status, unspecified: Secondary | ICD-10-CM | POA: Insufficient documentation

## 2024-01-31 DIAGNOSIS — Z5329 Procedure and treatment not carried out because of patient's decision for other reasons: Secondary | ICD-10-CM | POA: Insufficient documentation

## 2024-01-31 DIAGNOSIS — T401X1A Poisoning by heroin, accidental (unintentional), initial encounter: Secondary | ICD-10-CM | POA: Diagnosis present
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
## 2024-01-31 NOTE — ED Notes (Signed)
 Attempted to start IV, pt attempted to jerk IV away and stated she is not even shooting up right now so she does not want us  to stick her either. Pt stated she wants to leave AMA. Pt signed electronic AMA waiver and Dr Simon notified. Risks explained to pt, pt verbalized understanding. Pt walked out of department.

## 2024-01-31 NOTE — ED Triage Notes (Signed)
 Pt arriving via [facility] from [facility] for AMS/drowsiness. Pt was found at [facility] with her head in the sink. Pt admits to smoking marijuana she got from her friend but is not sure what was in it. Pt admits she also injects heroin but did not doit today. Pt opening eyes to speech but obviously confused.

## 2024-02-12 ENCOUNTER — Emergency Department: Payer: MEDICAID

## 2024-02-12 ENCOUNTER — Emergency Department
Admission: EM | Admit: 2024-02-12 | Discharge: 2024-02-12 | Payer: MEDICAID | Attending: Emergency Medicine | Admitting: Emergency Medicine

## 2024-02-12 DIAGNOSIS — F1910 Other psychoactive substance abuse, uncomplicated: Secondary | ICD-10-CM | POA: Insufficient documentation

## 2024-02-12 DIAGNOSIS — R079 Chest pain, unspecified: Secondary | ICD-10-CM | POA: Diagnosis present

## 2024-02-12 DIAGNOSIS — R0789 Other chest pain: Secondary | ICD-10-CM | POA: Diagnosis not present

## 2024-02-12 LAB — CBC
HCT: 36.1 % (ref 36.0–46.0)
Hemoglobin: 11.7 g/dL — ABNORMAL LOW (ref 12.0–15.0)
MCH: 30.2 pg (ref 26.0–34.0)
MCHC: 32.4 g/dL (ref 30.0–36.0)
MCV: 93 fL (ref 80.0–100.0)
Platelets: 195 K/uL (ref 150–400)
RBC: 3.88 MIL/uL (ref 3.87–5.11)
RDW: 14.2 % (ref 11.5–15.5)
WBC: 8.6 K/uL (ref 4.0–10.5)
nRBC: 0 % (ref 0.0–0.2)

## 2024-02-12 LAB — BASIC METABOLIC PANEL WITH GFR
Anion gap: 11 (ref 5–15)
BUN: 18 mg/dL (ref 6–20)
CO2: 23 mmol/L (ref 22–32)
Calcium: 9.3 mg/dL (ref 8.9–10.3)
Chloride: 106 mmol/L (ref 98–111)
Creatinine, Ser: 0.7 mg/dL (ref 0.44–1.00)
GFR, Estimated: >60 mL/min (ref >60)
Glucose, Bld: 76 mg/dL (ref 70–99)
Potassium: 3.7 mmol/L (ref 3.5–5.1)
Sodium: 140 mmol/L (ref 135–145)

## 2024-02-12 LAB — TROPONIN I (HIGH SENSITIVITY): Troponin I (High Sensitivity): 4 ng/L (ref <18)

## 2024-02-12 MED ORDER — IBUPROFEN 600 MG PO TABS
600.0000 mg | ORAL_TABLET | Freq: Once | ORAL | Status: AC
  Administered 2024-02-12: 600 mg via ORAL
  Filled 2024-02-12: qty 1

## 2024-02-12 MED ORDER — ONDANSETRON 4 MG PO TBDP
4.0000 mg | ORAL_TABLET | Freq: Once | ORAL | Status: AC
  Administered 2024-02-12: 4 mg via ORAL
  Filled 2024-02-12: qty 1
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
## 2024-07-09 ENCOUNTER — Emergency Department (HOSPITAL_BASED_OUTPATIENT_CLINIC_OR_DEPARTMENT_OTHER)
Admission: EM | Admit: 2024-07-09 | Discharge: 2024-07-09 | Disposition: A | Discharge status: Home / Self Care | Payer: MEDICAID | Attending: Emergency Medicine | Admitting: Emergency Medicine

## 2024-07-09 ENCOUNTER — Emergency Department (HOSPITAL_BASED_OUTPATIENT_CLINIC_OR_DEPARTMENT_OTHER): Payer: MEDICAID

## 2024-07-09 ENCOUNTER — Other Ambulatory Visit: Payer: Self-pay

## 2024-07-09 ENCOUNTER — Encounter (HOSPITAL_BASED_OUTPATIENT_CLINIC_OR_DEPARTMENT_OTHER): Payer: Self-pay | Admitting: Emergency Medicine

## 2024-07-09 DIAGNOSIS — Z5329 Procedure and treatment not carried out because of patient's decision for other reasons: Secondary | ICD-10-CM | POA: Diagnosis not present

## 2024-07-09 DIAGNOSIS — M25572 Pain in left ankle and joints of left foot: Secondary | ICD-10-CM | POA: Diagnosis present

## 2024-07-09 DIAGNOSIS — M009 Pyogenic arthritis, unspecified: Secondary | ICD-10-CM

## 2024-07-09 DIAGNOSIS — R2242 Localized swelling, mass and lump, left lower limb: Secondary | ICD-10-CM | POA: Diagnosis not present

## 2024-07-09 DIAGNOSIS — M25472 Effusion, left ankle: Secondary | ICD-10-CM

## 2024-07-09 MED ORDER — CEFTRIAXONE SODIUM 500 MG IJ SOLR
500.0000 mg | Freq: Once | INTRAMUSCULAR | Status: AC
  Administered 2024-07-09: 500 mg via INTRAMUSCULAR
  Filled 2024-07-09: qty 500

## 2024-07-09 MED ORDER — DOXYCYCLINE HYCLATE 100 MG PO TABS
100.0000 mg | ORAL_TABLET | Freq: Once | ORAL | Status: AC
  Administered 2024-07-09: 100 mg via ORAL
  Filled 2024-07-09: qty 1

## 2024-07-09 MED ORDER — DOXYCYCLINE HYCLATE 100 MG PO CAPS: 100.0000 mg | ORAL_CAPSULE | Freq: Two times a day (BID) | ORAL | 0 refills | Status: AC

## 2024-07-09 NOTE — Discharge Instructions (Signed)
 You are leaving the emergency room AGAINST MEDICAL ADVICE.  It is recommended that you stay for further workup, testing and admission.  We are concerned you have septic arthritis or an infection in your joint.  This is best taken care of with potentially surgery and IV antibiotics.  Please pick up antibiotic and take as prescribed. Call orthopedic office for follow-up. Return to emergency room at any time for further workup and evaluation.

## 2024-07-09 NOTE — ED Provider Notes (Signed)
 [place] EMERGENCY DEPARTMENT AT [facility] Provider Note   CSN: [id number] Arrival date & time: 07/09/24  1109     Patient presents with: Leg Swelling   Allison Castro is a 23 y.o. female patient with past medical history of polysubstance abuse including IV drug use last using fentanyl  yesterday presents to emergency room with complaint of left ankle pain redness and swelling for 3 days.  She tells me that she had this once before for her wrist and it lasted about 3 weeks and went away with a friend's antibiotic.  She denies any injury trauma or fall.  She denies any fever.  She is currently using crutches to walk and is unwilling to move the ankle joint. NO chest pain, shortness of breath, cough.    HPI     Prior to Admission medications   Medication Sig Start Date End Date Taking? Authorizing Provider  ferrous sulfate  325 (65 FE) MG tablet Take 1 tablet (325 mg total) by mouth 2 (two) times daily with a meal. 03/27/18   Dean Quant, CNM  levothyroxine  (SYNTHROID ) 175 MCG tablet Take 1 tablet (175 mcg total) by mouth daily before breakfast. Patient not taking: Reported on 02/13/2022 03/27/18   Dean Quant, CNM  medroxyPROGESTERone  (DEPO-PROVERA ) 150 MG/ML injection Inject 1 mL (150 mg total) into the muscle every 3 (three) months. Patient not taking: Reported on 02/13/2022 03/30/18   Verdon Keen, MD  pantoprazole  (PROTONIX ) 40 MG tablet Take 1 tablet (40 mg total) by mouth daily. 08/17/21 09/16/21  Claudene Arthea SQUIBB, MD  sucralfate  (CARAFATE ) 1 g tablet Take 1 tablet (1 g total) by mouth 4 (four) times daily for 5 days. 08/17/21 08/22/21  Claudene Arthea SQUIBB, MD  vitamin C (ASCORBIC ACID) 500 MG tablet Take twice daily with iron supplement 03/27/18   Dean Quant, CNM    Allergies: Patient has no known allergies.    Review of Systems  Musculoskeletal:  Positive for arthralgias.    Updated Vital Signs BP 111/79 (BP Location: Right Arm)   Pulse 91    Temp 98.4 F (36.9 C) (Oral)   Resp 18   Ht 5' (1.524 m)   Wt 45.4 kg   SpO2 95%   BMI 19.53 kg/m   Physical Exam Vitals and nursing note reviewed.  Constitutional:      General: She is not in acute distress.    Appearance: She is not toxic-appearing.  HENT:     Head: Normocephalic and atraumatic.  Eyes:     General: No scleral icterus.    Conjunctiva/sclera: Conjunctivae normal.  Cardiovascular:     Rate and Rhythm: Normal rate and regular rhythm.     Pulses: Normal pulses.     Heart sounds: Normal heart sounds.  Pulmonary:     Effort: Pulmonary effort is normal. No respiratory distress.     Breath sounds: Normal breath sounds.  Abdominal:     General: Abdomen is flat. Bowel sounds are normal.     Palpations: Abdomen is soft.     Tenderness: There is no abdominal tenderness.  Musculoskeletal:     Right lower leg: No edema.     Left lower leg: Edema present.     Comments: Left ankle is erythematous and swollen. No bruising. Unwilling to try active or passive range of motion of ankle.  Strong dorsal pedal pulse.  Skin:    General: Skin is warm and dry.     Findings: No lesion.  Neurological:  General: No focal deficit present.     Mental Status: She is alert and oriented to person, place, and time. Mental status is at baseline.     (all labs ordered are listed, but only abnormal results are displayed) Labs Reviewed - No data to display  EKG: None  Radiology: No results found.   Procedures   Medications Ordered in the ED - No data to display                                  Medical Decision Making Amount and/or Complexity of Data Reviewed Radiology: ordered.   This patient presents to the ED for concern of left ankle pain, this involves an extensive number of treatment options, and is a complaint that carries with it a high risk of complications and morbidity.  The differential diagnosis includes septic arthritis, osteomyelitis, fracture,  cellulitis   Co morbidities that complicate the patient evaluation  IVDU   Additional history obtained:  Additional history obtained from patient has been admitted for tubo-ovarian abscess 11/10/2022, last seen for polysubstance abuse 02/12/2024   Lab Tests:  I personally interpreted labs.  The pertinent results include:   Patient refused   Imaging Studies ordered:  I ordered imaging studies including x-ray of left ankle I independently visualized and interpreted imaging which showed suspected tibiotalar joint effusion with soft tissue swelling. I agree with the radiologist interpretation   Cardiac Monitoring: / EKG:  The patient was maintained on a cardiac monitor.      Problem List / ED Course / Critical interventions / Medication management  Patient presents to emergency room with complaint of left ankle pain swelling warmth erythema.  This was atraumatic and started spontaneously.  She has never been diagnosed with something like endocarditis before but she does have history of IV drug use.  She does still use IV drugs.  Her x-ray shows possible joint effusion with no acute fracture.  Given her current presentation I am concerned for infection specifically septic joint which requires further workup, treatment and admission.  Discussed with patient will ultimately declines and wants oral antibiotics and discharge.  I ordered medication including doxycycline , Rocephin . Reevaluation of the patient after these medicines showed that the patient stayed the same I have reviewed the patients home medicines and have made adjustments as needed I am concerned about the diagnosis of septic arthritis given patient's history of IV drug use and clinical presentation.  Patient is refusing lab work and further treatment as well as admission.  She appears capable of making her own medical decisions and she would like to leave AGAINST MEDICAL ADVICE.  She understands the risks of leaving and the  importance of staying/returning.  She would still like to proceed with leaving.  Will trial oral antibiotic although not standard of care.  Given first dose here.  Encouraged to return.        Final diagnoses:  Pain and swelling of left ankle    ED Discharge Orders          Ordered    doxycycline  (VIBRAMYCIN ) 100 MG capsule  2 times daily        07/09/24 1323               Alyjah Lovingood, Warren SAILOR, PA-C 07/09/24 1324    Yolande Lamar BROCKS, MD 07/11/24 [phone]

## 2024-07-09 NOTE — ED Triage Notes (Addendum)
 Pt with redness, swelling and pain to LLE (foot,ankle, lower leg) for a few days; denies injury, no wound noted
